# Patient Record
Sex: Female | Born: 1945 | Race: White | Hispanic: No | Marital: Married | Smoking: Former smoker
Health system: Southern US, Community
[De-identification: ages and names within clinical notes are randomized; demographics above are authoritative.]

## PROBLEM LIST (undated history)

## (undated) DIAGNOSIS — E785 Hyperlipidemia, unspecified: Secondary | ICD-10-CM

## (undated) DIAGNOSIS — I639 Cerebral infarction, unspecified: Secondary | ICD-10-CM

## (undated) HISTORY — DX: Hyperlipidemia, unspecified: E78.5

## (undated) HISTORY — PX: ECTOPIC PREGNANCY SURGERY: SHX613

## (undated) HISTORY — DX: Cerebral infarction, unspecified: I63.9

---

## 1999-06-06 ENCOUNTER — Other Ambulatory Visit: Admission: RE | Admit: 1999-06-06 | Discharge: 1999-06-06 | Payer: Self-pay | Admitting: Obstetrics and Gynecology

## 2000-06-12 ENCOUNTER — Other Ambulatory Visit: Admission: RE | Admit: 2000-06-12 | Discharge: 2000-06-12 | Payer: Self-pay | Admitting: Obstetrics and Gynecology

## 2000-06-13 ENCOUNTER — Encounter (INDEPENDENT_AMBULATORY_CARE_PROVIDER_SITE_OTHER): Payer: Self-pay

## 2000-06-13 ENCOUNTER — Other Ambulatory Visit: Admission: RE | Admit: 2000-06-13 | Discharge: 2000-06-13 | Payer: Self-pay | Admitting: Obstetrics and Gynecology

## 2000-07-26 ENCOUNTER — Ambulatory Visit (HOSPITAL_COMMUNITY): Admission: RE | Admit: 2000-07-26 | Discharge: 2000-07-26 | Payer: Self-pay | Admitting: Obstetrics and Gynecology

## 2000-08-01 ENCOUNTER — Encounter: Admission: RE | Admit: 2000-08-01 | Discharge: 2000-08-01 | Payer: Self-pay | Admitting: Obstetrics and Gynecology

## 2000-08-01 ENCOUNTER — Encounter: Payer: Self-pay | Admitting: Obstetrics and Gynecology

## 2000-11-29 ENCOUNTER — Ambulatory Visit (HOSPITAL_COMMUNITY): Admission: RE | Admit: 2000-11-29 | Discharge: 2000-11-29 | Payer: Self-pay | Admitting: Gastroenterology

## 2001-06-17 ENCOUNTER — Other Ambulatory Visit: Admission: RE | Admit: 2001-06-17 | Discharge: 2001-06-17 | Payer: Self-pay | Admitting: Obstetrics and Gynecology

## 2001-08-05 ENCOUNTER — Encounter: Payer: Self-pay | Admitting: Obstetrics and Gynecology

## 2001-08-05 ENCOUNTER — Encounter: Admission: RE | Admit: 2001-08-05 | Discharge: 2001-08-05 | Payer: Self-pay | Admitting: Obstetrics and Gynecology

## 2001-08-08 ENCOUNTER — Encounter: Payer: Self-pay | Admitting: Obstetrics and Gynecology

## 2001-08-08 ENCOUNTER — Encounter: Admission: RE | Admit: 2001-08-08 | Discharge: 2001-08-08 | Payer: Self-pay | Admitting: Obstetrics and Gynecology

## 2002-06-24 ENCOUNTER — Other Ambulatory Visit: Admission: RE | Admit: 2002-06-24 | Discharge: 2002-06-24 | Payer: Self-pay | Admitting: Obstetrics and Gynecology

## 2002-08-11 ENCOUNTER — Ambulatory Visit (HOSPITAL_COMMUNITY): Admission: RE | Admit: 2002-08-11 | Discharge: 2002-08-11 | Payer: Self-pay | Admitting: Obstetrics and Gynecology

## 2002-08-11 ENCOUNTER — Encounter: Payer: Self-pay | Admitting: Obstetrics and Gynecology

## 2003-07-01 ENCOUNTER — Other Ambulatory Visit: Admission: RE | Admit: 2003-07-01 | Discharge: 2003-07-01 | Payer: Self-pay | Admitting: Obstetrics and Gynecology

## 2003-08-13 ENCOUNTER — Encounter: Payer: Self-pay | Admitting: Obstetrics and Gynecology

## 2003-08-13 ENCOUNTER — Ambulatory Visit (HOSPITAL_COMMUNITY): Admission: RE | Admit: 2003-08-13 | Discharge: 2003-08-13 | Payer: Self-pay | Admitting: Obstetrics and Gynecology

## 2004-07-06 ENCOUNTER — Other Ambulatory Visit: Admission: RE | Admit: 2004-07-06 | Discharge: 2004-07-06 | Payer: Self-pay | Admitting: Obstetrics and Gynecology

## 2004-08-16 ENCOUNTER — Ambulatory Visit (HOSPITAL_COMMUNITY): Admission: RE | Admit: 2004-08-16 | Discharge: 2004-08-16 | Payer: Self-pay | Admitting: Obstetrics and Gynecology

## 2005-08-28 ENCOUNTER — Ambulatory Visit (HOSPITAL_COMMUNITY): Admission: RE | Admit: 2005-08-28 | Discharge: 2005-08-28 | Payer: Self-pay | Admitting: Obstetrics and Gynecology

## 2005-09-11 ENCOUNTER — Other Ambulatory Visit: Admission: RE | Admit: 2005-09-11 | Discharge: 2005-09-11 | Payer: Self-pay | Admitting: Obstetrics and Gynecology

## 2006-04-15 ENCOUNTER — Encounter (INDEPENDENT_AMBULATORY_CARE_PROVIDER_SITE_OTHER): Payer: Self-pay | Admitting: Specialist

## 2006-04-15 ENCOUNTER — Ambulatory Visit (HOSPITAL_COMMUNITY): Admission: RE | Admit: 2006-04-15 | Discharge: 2006-04-15 | Payer: Self-pay | Admitting: Gastroenterology

## 2006-08-29 ENCOUNTER — Ambulatory Visit (HOSPITAL_COMMUNITY): Admission: RE | Admit: 2006-08-29 | Discharge: 2006-08-29 | Payer: Self-pay | Admitting: Obstetrics and Gynecology

## 2007-09-02 ENCOUNTER — Ambulatory Visit (HOSPITAL_COMMUNITY): Admission: RE | Admit: 2007-09-02 | Discharge: 2007-09-02 | Payer: Self-pay | Admitting: Obstetrics and Gynecology

## 2008-04-20 ENCOUNTER — Emergency Department (HOSPITAL_COMMUNITY): Admission: EM | Admit: 2008-04-20 | Discharge: 2008-04-20 | Payer: Self-pay | Admitting: Emergency Medicine

## 2008-09-03 ENCOUNTER — Ambulatory Visit (HOSPITAL_COMMUNITY): Admission: RE | Admit: 2008-09-03 | Discharge: 2008-09-03 | Payer: Self-pay | Admitting: Obstetrics and Gynecology

## 2008-11-04 ENCOUNTER — Emergency Department (HOSPITAL_COMMUNITY): Admission: EM | Admit: 2008-11-04 | Discharge: 2008-11-04 | Payer: Self-pay | Admitting: Family Medicine

## 2009-05-31 ENCOUNTER — Emergency Department (HOSPITAL_COMMUNITY): Admission: EM | Admit: 2009-05-31 | Discharge: 2009-05-31 | Payer: Self-pay | Admitting: Family Medicine

## 2009-09-05 ENCOUNTER — Ambulatory Visit (HOSPITAL_COMMUNITY): Admission: RE | Admit: 2009-09-05 | Discharge: 2009-09-05 | Payer: Self-pay | Admitting: Obstetrics and Gynecology

## 2009-09-07 ENCOUNTER — Ambulatory Visit (HOSPITAL_COMMUNITY): Admission: RE | Admit: 2009-09-07 | Discharge: 2009-09-07 | Payer: Self-pay | Admitting: Obstetrics and Gynecology

## 2009-09-20 ENCOUNTER — Ambulatory Visit: Payer: Self-pay | Admitting: Sports Medicine

## 2009-09-20 DIAGNOSIS — S8263XA Displaced fracture of lateral malleolus of unspecified fibula, initial encounter for closed fracture: Secondary | ICD-10-CM | POA: Insufficient documentation

## 2009-09-20 DIAGNOSIS — M25579 Pain in unspecified ankle and joints of unspecified foot: Secondary | ICD-10-CM | POA: Insufficient documentation

## 2009-10-19 ENCOUNTER — Ambulatory Visit: Payer: Self-pay | Admitting: Sports Medicine

## 2010-10-10 ENCOUNTER — Ambulatory Visit (HOSPITAL_COMMUNITY)
Admission: RE | Admit: 2010-10-10 | Discharge: 2010-10-10 | Payer: Self-pay | Source: Home / Self Care | Admitting: Obstetrics and Gynecology

## 2011-03-30 NOTE — Op Note (Signed)
Rachel Douglas, Rachel Douglas             ACCOUNT NO.:  [id number]   MEDICAL RECORD NO.:  [id number]          PATIENT TYPE:  AMB   LOCATION:  ENDO                         FACILITY:  [facility]   PHYSICIAN:  John C. Madilyn Fireman, M.D.    DATE OF BIRTH:  [birth year]   DATE OF PROCEDURE:  04/15/2006  DATE OF DISCHARGE:                                 OPERATIVE REPORT   PROCEDURE:  Colonoscopy with polypectomy.   INDICATIONS FOR PROCEDURE:  History of adenomatous colon polyps with last  colonoscopy 5 years ago.   PROCEDURE:  The patient was placed in the left lateral decubitus position  and placed on the pulse monitor with continuous low-flow oxygen delivered by  nasal cannula.  He was sedated with 50 mcg IV fentanyl and 6 mg IV Versed.  The Olympus video colonoscope was inserted in the rectum and advanced to the  cecum, confirmed by transillumination at McBurney's point and visualization  of the ileocecal valve and appendiceal orifice, the prep was excellent.  The  cecum, ascending, transverse and descending colon all appeared normal with  no masses, polyps, diverticula or other mucosal abnormalities.  Within the  sigmoid colon, there was seen a 6 mm sessile polyp which was removed by hot  biopsy. The remainder of the sigmoid and rectum appeared normal.  The scope  was then withdrawn and the patient returned to the recovery room in stable  condition. She tolerated the procedure well and there were no immediate  complications.   IMPRESSION:  Sigmoid colon polyp, otherwise normal study.   PLAN:  Await histology and will probably repeat study in 5 years.           ______________________________  Everardo All Madilyn Fireman, M.D.     [facility]/MEDQ  D:  04/15/2006  T:  04/15/2006  Job:  [id number]   cc:   Miguel Aschoff, M.D.  Fax: [phone]

## 2011-03-30 NOTE — Procedures (Signed)
[facility]  Patient:    Rachel Douglas, Rachel Douglas                    MRN: [id number] Proc. Date: 11/29/00 Adm. Date:  [id number] Disc. Date: [id number] Attending:  Louie Bun CC:         Charles A. Tenny Craw, M.D.   Procedure Report  PROCEDURE:  Colonoscopy.  INDICATIONS FOR PROCEDURE:  History of colon polyps with last colonoscopy three years ago.  DESCRIPTION OF PROCEDURE:  The patient was placed in the left lateral decubitus position and placed on the pulse monitor with continuous low flow oxygen delivered by nasal cannula. She was sedated with 60 mg IV Demerol and 6 mg IV Versed. The Olympus video colonoscope was inserted into the rectum and advanced to the cecum, confirmed by transillumination at McBurneys point and visualization of the ileocecal valve and appendiceal orifice. The prep was excellent. The cecum, ascending, transverse, descending and sigmoid colon all appeared normal with no masses, polyps, diverticula or other mucosal abnormalities. The rectum likewise appeared normal and retroflexed view of the anus revealed some small internal hemorrhoids. The colonoscope was then withdrawn and the patient returned to the recovery room in stable condition. The patient tolerated the procedure well and there were no immediate complications.  IMPRESSION:  Internal hemorrhoids otherwise normal colonoscopy.  PLAN:  Consider repeat colonoscopy in approximately five years. DD:  11/29/00 TD:  11/30/00 Job: [id number]

## 2011-03-30 NOTE — Op Note (Signed)
[facility]  Patient:    Rachel Douglas, Rachel Douglas                    MRN: [id number] Proc. Date: 07/26/00 Adm. Date:  [id number] Disc. Date: [id number] Attending:  Michaele Offer                           Operative Report  PREOPERATIVE DIAGNOSIS:  Postmenopausal bleeding and probable endometrial polyp.  POSTOPERATIVE DIAGNOSIS:  Postmenopausal bleeding and atrial thick endometrium.  PROCEDURE:  Hysteroscopy with dilation and curettage.  SURGEON:  Zenaida Niece, M.D.  ANESTHESIA:  General with an LMA.  ESTIMATED BLOOD LOSS:  Less than 50 cc.  FINDINGS:  Normal appearing uterus with 2-3 small sessile pieces of endometrial tissue.  The endometrium was otherwise atrophic with no significant lesions.  Counts correct.  Condition stable.  DESCRIPTION OF PROCEDURE:  After appropriate informed consent was obtained, the patient was taken to the operating room and placed in the dorsal supine position.  General anesthesia was induced, and she was placed in mobile stirrups.  Her perineum and vagina were then prepped and draped in the usual sterile fashion and her bladder drained with a red rubber catheter.  A weighted speculum was inserted in the vagina, and a running retractor used anteriorly.  The cervix was grasped with a single-tooth tenaculum on the anterior lip.  The uterus then sounded to 9 cm.  The cervix was gradually dilated to a size 31 Hegar dilator without much difficulty.  The resectoscope was then inserted, and good pictures were obtained.  The 2-3 small sessile pieces of endometrial tissue were removed just by rubbing across then, and there was no significant bleeding.  Adequate visualization was achieved and again, there were no other significant lesions noted.  The hysteroscope was then removed, and gentle curettage was performed to make sure there was no significant tissue, and no tissue was obtained.  The single-tooth tenaculum was  then removed and bleeding controlled with pressure.  All instruments were then removed from the vagina.  The patient was awakened in the operating room, tolerated the procedure well, and taken to the recovery room in stable condition.  Ins and outs through the hysteroscope showed no significant fluid deficit.DD:  07/26/00 TD:  07/28/00 Job: [id number]

## 2011-08-09 LAB — URINALYSIS, MICROSCOPIC ONLY
Bilirubin Urine: NEGATIVE
Glucose, UA: NEGATIVE
Protein, ur: 30 — AB
Specific Gravity, Urine: 1.02
Urobilinogen, UA: 0.2

## 2011-08-09 LAB — POCT URINALYSIS DIP (DEVICE)
Bilirubin Urine: NEGATIVE
Ketones, ur: NEGATIVE
Operator id: 247071

## 2011-08-09 LAB — URINE CULTURE: Colony Count: 3000

## 2011-09-04 ENCOUNTER — Other Ambulatory Visit (HOSPITAL_COMMUNITY): Payer: Self-pay | Admitting: Obstetrics and Gynecology

## 2011-09-04 DIAGNOSIS — Z1231 Encounter for screening mammogram for malignant neoplasm of breast: Secondary | ICD-10-CM

## 2011-10-12 ENCOUNTER — Emergency Department (HOSPITAL_COMMUNITY)
Admission: EM | Admit: 2011-10-12 | Discharge: 2011-10-12 | Disposition: A | Discharge status: Home / Self Care | Payer: 59 | Source: Home / Self Care | Attending: Family Medicine | Admitting: Family Medicine

## 2011-10-12 DIAGNOSIS — S8012XA Contusion of left lower leg, initial encounter: Secondary | ICD-10-CM

## 2011-10-12 DIAGNOSIS — S8010XA Contusion of unspecified lower leg, initial encounter: Secondary | ICD-10-CM

## 2011-10-12 NOTE — ED Notes (Signed)
Patient  noted a bruise on left calf yest, and today the area is painful; does not recall injury to area. Co-worker drew circle w pen , and advised her she should be checked for a clot , since this is how his problem started out; no calf  pain w extension or flexion of foot

## 2011-10-12 NOTE — ED Provider Notes (Signed)
History     CSN: [id number] Arrival date & time: 10/12/2011  6:33 PM   First MD Initiated Contact with Patient 10/12/11 1659      Chief Complaint  Patient presents with  . Leg Pain    (Consider location/radiation/quality/duration/timing/severity/associated sxs/prior treatment) HPI Comments: PATIENT STATES SHE NOTED A BRUISE ON HER LEFT CALF YESTERDAY. SOME PAIN TODAY. DOES NOT RECALL INJURY. STATES A COWORKER TOLD HER TO GET CHECKED FOR A BLOOD CLOT. NO HX OF CLOTS OR BLEEDING PROBLEMS.   The history is provided by the patient.    History reviewed. No pertinent past medical history.  History reviewed. No pertinent past surgical history.  History reviewed. No pertinent family history.  History  Substance Use Topics  . Smoking status: Not on file  . Smokeless tobacco: Not on file  . Alcohol Use: Not on file    OB History    Grav Para Term Preterm Abortions TAB SAB Ect Mult Living                  Review of Systems  Constitutional: Negative.   HENT: Negative.   Respiratory: Negative.   Cardiovascular: Negative.   Gastrointestinal: Negative.   Genitourinary: Negative.   Hematological: Negative.     Allergies  Review of patient's allergies indicates no known allergies.  Home Medications  No current outpatient prescriptions on file.  BP 143/77  Pulse 61  Temp(Src) 98.6 F (37 C) (Oral)  Resp 12  SpO2 100%  Physical Exam  Nursing note and vitals reviewed. Constitutional: She appears well-developed and well-nourished. No distress.  Cardiovascular: Normal rate and regular rhythm.   Pulmonary/Chest: Effort normal and breath sounds normal.  Skin:       CONTUSION NOTED MEDIAL LEFT CALF. SLIGHT TENDERNESS. NEG HOMANS. ROM INTACT. NO CORDS. NO SWELLING OR ERYTHEMA    ED Course  Procedures (including critical care time)  Labs Reviewed - No data to display No results found.   1. Contusion of lower leg, left       MDM          Randa Spike, MD 10/12/11 [phone]

## 2011-10-15 ENCOUNTER — Ambulatory Visit (HOSPITAL_COMMUNITY)
Admission: RE | Admit: 2011-10-15 | Discharge: 2011-10-15 | Disposition: A | Discharge status: Home / Self Care | Payer: 59 | Source: Ambulatory Visit | Attending: Obstetrics and Gynecology | Admitting: Obstetrics and Gynecology

## 2011-10-15 DIAGNOSIS — Z1231 Encounter for screening mammogram for malignant neoplasm of breast: Secondary | ICD-10-CM | POA: Insufficient documentation

## 2011-12-10 ENCOUNTER — Other Ambulatory Visit (HOSPITAL_COMMUNITY): Payer: Self-pay | Admitting: Urology

## 2011-12-13 ENCOUNTER — Ambulatory Visit (HOSPITAL_COMMUNITY)
Admission: RE | Admit: 2011-12-13 | Discharge: 2011-12-13 | Disposition: A | Discharge status: Home / Self Care | Payer: 59 | Source: Ambulatory Visit | Attending: Urology | Admitting: Urology

## 2011-12-13 DIAGNOSIS — Q619 Cystic kidney disease, unspecified: Secondary | ICD-10-CM | POA: Insufficient documentation

## 2011-12-13 DIAGNOSIS — R319 Hematuria, unspecified: Secondary | ICD-10-CM | POA: Insufficient documentation

## 2012-08-13 ENCOUNTER — Encounter: Payer: 59 | Attending: Obstetrics and Gynecology | Admitting: Dietician

## 2012-08-13 ENCOUNTER — Encounter: Payer: Self-pay | Admitting: Dietician

## 2012-08-13 VITALS — Ht 61.0 in | Wt 146.1 lb

## 2012-08-13 DIAGNOSIS — Z713 Dietary counseling and surveillance: Secondary | ICD-10-CM | POA: Insufficient documentation

## 2012-08-13 DIAGNOSIS — E785 Hyperlipidemia, unspecified: Secondary | ICD-10-CM | POA: Insufficient documentation

## 2012-08-13 NOTE — Progress Notes (Signed)
  Medical Nutrition Therapy:  Appt start time: 1630 end time:  1730.   Assessment:  Primary concerns today: Comes today with issues around borderline lipid levels. Wants to keep her cholesterol levels as normal as possible.  Does not want to have to go on cholesterol lowering agents.  Has done reading and some dietary research and is currently trying to follow an eating plan for lowering cholesterol/fat, is trying to regularly exercise and to lose weight.  She does admit that with her husband going to work on the second shift, she is finding herself snack more in the evening.  Snacks are most often processed foods that contain calories and fat. 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  While she does not buy candy, she will more frequently is having a piece or two of candy each day from the dish.  MEDICATIONS: Currently on no medications.   DIETARY INTAKE:  Usual eating pattern includes 3 meals and 1-2 snacks per day.  24-hr recall:  B ( AM): oatmeal or yogurt. (high protein)  Snk ( AM): drink Naked that contains vegetables and fruits.  L ( PM): Pita bread and tuna sandwich or bowl of vegetarian soup.water. Snk ( PM): none D ( PM): chick filet'a with a small fry.  Meat (red) now doing more chicken, vegetable.+/- potatoes. Snk ( PM): While watching TV the refrigerator is fair game.  Will snack on chips and items from the refrigerator.   Beverages: water, coffee, juice.    Usual physical activity: Monday, Tuesday, Wednesday and Friday, exercises. Combination of  pilates, aerobics and aerobic mix. For 45-60 minutes.  On the weekend does yard work or gardening.    Estimated energy needs:HT:61 in  WT: 146.1 lb  BMI: 27.7 kg/m2  Adj WT: 118 lb (54 kg) 1300-1400 calories 150-155 g carbohydrates 100-105  g protein 35-38 g fat  Progress Towards Goal(s):  In progress.   Nutritional Diagnosis:  Metabolic Imbalance;  Hyperlipidemia;  as evidenced by total cholesterol of 209, LDL cholesterol of 127 mg.    Intervention:  Nutrition Review of the normal ranges for cholesterol and the implications for continuing and increasing her current exercise regimen to assist with increasing her HDL cholesterol, decreasing her LDL cholesterol and weight loss which would facilitate improvement in both.  Discussed the use of fruits, complex carbs and lean proteins as snacks.  Encouraged using exercise, portion control and improved food choices to facilitate weight loss for improved weight loss and improved blood lipids.  Handouts given during visit include:  Understanding and Managing your Cholesterol  [facility] handout for Improving HDL, LDL, and Triglycerides.  Cholesterol and Triglycerides: The Norms and Mine.  Monitoring/Evaluation:  Dietary intake, exercise,and body weight 12 weeks.Marland Kitchen

## 2012-09-15 ENCOUNTER — Other Ambulatory Visit (HOSPITAL_COMMUNITY): Payer: Self-pay | Admitting: Obstetrics and Gynecology

## 2012-09-15 DIAGNOSIS — Z1231 Encounter for screening mammogram for malignant neoplasm of breast: Secondary | ICD-10-CM

## 2012-10-15 ENCOUNTER — Ambulatory Visit (HOSPITAL_COMMUNITY)
Admission: RE | Admit: 2012-10-15 | Discharge: 2012-10-15 | Disposition: A | Discharge status: Home / Self Care | Payer: 59 | Source: Ambulatory Visit | Attending: Obstetrics and Gynecology | Admitting: Obstetrics and Gynecology

## 2012-10-15 DIAGNOSIS — Z1231 Encounter for screening mammogram for malignant neoplasm of breast: Secondary | ICD-10-CM | POA: Insufficient documentation

## 2013-09-21 ENCOUNTER — Other Ambulatory Visit (HOSPITAL_COMMUNITY): Payer: Self-pay | Admitting: Obstetrics and Gynecology

## 2013-09-21 DIAGNOSIS — Z1231 Encounter for screening mammogram for malignant neoplasm of breast: Secondary | ICD-10-CM

## 2013-10-16 ENCOUNTER — Ambulatory Visit (HOSPITAL_COMMUNITY)
Admission: RE | Admit: 2013-10-16 | Discharge: 2013-10-16 | Disposition: A | Discharge status: Home / Self Care | Payer: 59 | Source: Ambulatory Visit | Attending: Obstetrics and Gynecology | Admitting: Obstetrics and Gynecology

## 2013-10-16 DIAGNOSIS — Z1231 Encounter for screening mammogram for malignant neoplasm of breast: Secondary | ICD-10-CM | POA: Insufficient documentation

## 2013-12-09 ENCOUNTER — Other Ambulatory Visit: Payer: Self-pay | Admitting: Physician Assistant

## 2013-12-23 ENCOUNTER — Other Ambulatory Visit: Payer: Self-pay | Admitting: Physician Assistant

## 2014-11-29 ENCOUNTER — Other Ambulatory Visit (HOSPITAL_COMMUNITY): Payer: Self-pay | Admitting: Obstetrics and Gynecology

## 2014-11-29 DIAGNOSIS — Z1231 Encounter for screening mammogram for malignant neoplasm of breast: Secondary | ICD-10-CM

## 2015-01-31 ENCOUNTER — Ambulatory Visit (HOSPITAL_COMMUNITY)
Admission: RE | Admit: 2015-01-31 | Discharge: 2015-01-31 | Disposition: A | Discharge status: Home / Self Care | Payer: Medicare Other | Source: Ambulatory Visit | Attending: Obstetrics and Gynecology | Admitting: Obstetrics and Gynecology

## 2015-01-31 DIAGNOSIS — Z1231 Encounter for screening mammogram for malignant neoplasm of breast: Secondary | ICD-10-CM | POA: Insufficient documentation

## 2015-12-28 ENCOUNTER — Other Ambulatory Visit: Payer: Self-pay

## 2015-12-28 DIAGNOSIS — Z1231 Encounter for screening mammogram for malignant neoplasm of breast: Secondary | ICD-10-CM

## 2016-01-10 DIAGNOSIS — H1033 Unspecified acute conjunctivitis, bilateral: Secondary | ICD-10-CM | POA: Diagnosis not present

## 2016-02-01 ENCOUNTER — Ambulatory Visit
Admission: RE | Admit: 2016-02-01 | Discharge: 2016-02-01 | Disposition: A | Discharge status: Home / Self Care | Payer: Medicare HMO | Source: Ambulatory Visit

## 2016-02-01 DIAGNOSIS — Z1231 Encounter for screening mammogram for malignant neoplasm of breast: Secondary | ICD-10-CM | POA: Diagnosis not present

## 2016-02-22 DIAGNOSIS — E7800 Pure hypercholesterolemia, unspecified: Secondary | ICD-10-CM | POA: Diagnosis not present

## 2016-02-22 DIAGNOSIS — Z131 Encounter for screening for diabetes mellitus: Secondary | ICD-10-CM | POA: Diagnosis not present

## 2016-02-29 DIAGNOSIS — G43909 Migraine, unspecified, not intractable, without status migrainosus: Secondary | ICD-10-CM | POA: Diagnosis not present

## 2016-02-29 DIAGNOSIS — Z0000 Encounter for general adult medical examination without abnormal findings: Secondary | ICD-10-CM | POA: Diagnosis not present

## 2016-02-29 DIAGNOSIS — G5600 Carpal tunnel syndrome, unspecified upper limb: Secondary | ICD-10-CM | POA: Diagnosis not present

## 2016-05-11 DIAGNOSIS — R69 Illness, unspecified: Secondary | ICD-10-CM | POA: Diagnosis not present

## 2016-06-20 DIAGNOSIS — L570 Actinic keratosis: Secondary | ICD-10-CM | POA: Diagnosis not present

## 2016-06-20 DIAGNOSIS — L7211 Pilar cyst: Secondary | ICD-10-CM | POA: Diagnosis not present

## 2016-06-20 DIAGNOSIS — D239 Other benign neoplasm of skin, unspecified: Secondary | ICD-10-CM | POA: Diagnosis not present

## 2016-07-12 ENCOUNTER — Other Ambulatory Visit: Payer: Self-pay | Admitting: Physician Assistant

## 2016-07-12 DIAGNOSIS — D485 Neoplasm of uncertain behavior of skin: Secondary | ICD-10-CM | POA: Diagnosis not present

## 2016-07-12 DIAGNOSIS — L7211 Pilar cyst: Secondary | ICD-10-CM | POA: Diagnosis not present

## 2016-07-23 DIAGNOSIS — H5202 Hypermetropia, left eye: Secondary | ICD-10-CM | POA: Diagnosis not present

## 2016-07-23 DIAGNOSIS — H52223 Regular astigmatism, bilateral: Secondary | ICD-10-CM | POA: Diagnosis not present

## 2016-07-23 DIAGNOSIS — H5211 Myopia, right eye: Secondary | ICD-10-CM | POA: Diagnosis not present

## 2016-07-23 DIAGNOSIS — H2513 Age-related nuclear cataract, bilateral: Secondary | ICD-10-CM | POA: Diagnosis not present

## 2016-07-26 DIAGNOSIS — Z86010 Personal history of colonic polyps: Secondary | ICD-10-CM | POA: Diagnosis not present

## 2016-07-26 DIAGNOSIS — D126 Benign neoplasm of colon, unspecified: Secondary | ICD-10-CM | POA: Diagnosis not present

## 2016-07-26 DIAGNOSIS — K5730 Diverticulosis of large intestine without perforation or abscess without bleeding: Secondary | ICD-10-CM | POA: Diagnosis not present

## 2016-08-02 DIAGNOSIS — D126 Benign neoplasm of colon, unspecified: Secondary | ICD-10-CM | POA: Diagnosis not present

## 2016-08-04 DIAGNOSIS — Z23 Encounter for immunization: Secondary | ICD-10-CM | POA: Diagnosis not present

## 2016-09-07 ENCOUNTER — Encounter (HOSPITAL_COMMUNITY): Payer: Self-pay | Admitting: Emergency Medicine

## 2016-09-07 ENCOUNTER — Inpatient Hospital Stay (HOSPITAL_COMMUNITY)
Admission: EM | Admit: 2016-09-07 | Discharge: 2016-09-09 | DRG: 066 | Disposition: A | Discharge status: Home / Self Care | Payer: Medicare HMO | Attending: Internal Medicine | Admitting: Internal Medicine

## 2016-09-07 ENCOUNTER — Emergency Department (HOSPITAL_COMMUNITY): Payer: Medicare HMO

## 2016-09-07 DIAGNOSIS — R269 Unspecified abnormalities of gait and mobility: Secondary | ICD-10-CM

## 2016-09-07 DIAGNOSIS — I6789 Other cerebrovascular disease: Secondary | ICD-10-CM | POA: Diagnosis not present

## 2016-09-07 DIAGNOSIS — R29898 Other symptoms and signs involving the musculoskeletal system: Secondary | ICD-10-CM | POA: Diagnosis not present

## 2016-09-07 DIAGNOSIS — Z9889 Other specified postprocedural states: Secondary | ICD-10-CM

## 2016-09-07 DIAGNOSIS — Z7982 Long term (current) use of aspirin: Secondary | ICD-10-CM | POA: Diagnosis not present

## 2016-09-07 DIAGNOSIS — Z833 Family history of diabetes mellitus: Secondary | ICD-10-CM

## 2016-09-07 DIAGNOSIS — E785 Hyperlipidemia, unspecified: Secondary | ICD-10-CM | POA: Diagnosis not present

## 2016-09-07 DIAGNOSIS — Z823 Family history of stroke: Secondary | ICD-10-CM

## 2016-09-07 DIAGNOSIS — R296 Repeated falls: Secondary | ICD-10-CM | POA: Diagnosis not present

## 2016-09-07 DIAGNOSIS — I63519 Cerebral infarction due to unspecified occlusion or stenosis of unspecified middle cerebral artery: Secondary | ICD-10-CM | POA: Diagnosis not present

## 2016-09-07 DIAGNOSIS — I639 Cerebral infarction, unspecified: Principal | ICD-10-CM | POA: Diagnosis present

## 2016-09-07 DIAGNOSIS — M6281 Muscle weakness (generalized): Secondary | ICD-10-CM | POA: Diagnosis not present

## 2016-09-07 DIAGNOSIS — I10 Essential (primary) hypertension: Secondary | ICD-10-CM | POA: Diagnosis not present

## 2016-09-07 DIAGNOSIS — Z79899 Other long term (current) drug therapy: Secondary | ICD-10-CM | POA: Diagnosis not present

## 2016-09-07 DIAGNOSIS — Z87891 Personal history of nicotine dependence: Secondary | ICD-10-CM | POA: Diagnosis not present

## 2016-09-07 DIAGNOSIS — R350 Frequency of micturition: Secondary | ICD-10-CM | POA: Diagnosis not present

## 2016-09-07 DIAGNOSIS — N390 Urinary tract infection, site not specified: Secondary | ICD-10-CM | POA: Diagnosis not present

## 2016-09-07 DIAGNOSIS — Z809 Family history of malignant neoplasm, unspecified: Secondary | ICD-10-CM

## 2016-09-07 LAB — DIFFERENTIAL
Basophils Absolute: 0.1 10*3/uL (ref 0.0–0.1)
Basophils Relative: 1 %
EOS ABS: 0.3 10*3/uL (ref 0.0–0.7)
EOS PCT: 4 %
LYMPHS ABS: 2.6 10*3/uL (ref 0.7–4.0)
Lymphocytes Relative: 35 %
Monocytes Absolute: 0.7 10*3/uL (ref 0.1–1.0)
Monocytes Relative: 9 %
NEUTROS PCT: 51 %
Neutro Abs: 3.9 10*3/uL (ref 1.7–7.7)

## 2016-09-07 LAB — URINE MICROSCOPIC-ADD ON

## 2016-09-07 LAB — COMPREHENSIVE METABOLIC PANEL
ALBUMIN: 4.4 g/dL (ref 3.5–5.0)
ALT: 18 U/L (ref 14–54)
ANION GAP: 6 (ref 5–15)
AST: 15 U/L (ref 15–41)
Alkaline Phosphatase: 58 U/L (ref 38–126)
BILIRUBIN TOTAL: 0.3 mg/dL (ref 0.3–1.2)
BUN: 24 mg/dL — ABNORMAL HIGH (ref 6–20)
CO2: 28 mmol/L (ref 22–32)
Calcium: 10.2 mg/dL (ref 8.9–10.3)
Chloride: 106 mmol/L (ref 101–111)
Creatinine, Ser: 0.75 mg/dL (ref 0.44–1.00)
GLUCOSE: 95 mg/dL (ref 65–99)
POTASSIUM: 4 mmol/L (ref 3.5–5.1)
Sodium: 140 mmol/L (ref 135–145)
TOTAL PROTEIN: 7.3 g/dL (ref 6.5–8.1)

## 2016-09-07 LAB — URINALYSIS, ROUTINE W REFLEX MICROSCOPIC
BILIRUBIN URINE: NEGATIVE
Glucose, UA: NEGATIVE mg/dL
KETONES UR: NEGATIVE mg/dL
NITRITE: NEGATIVE
PROTEIN: NEGATIVE mg/dL
Specific Gravity, Urine: 1.012 (ref 1.005–1.030)
pH: 5.5 (ref 5.0–8.0)

## 2016-09-07 LAB — PROTIME-INR
INR: 0.93
PROTHROMBIN TIME: 12.4 s (ref 11.4–15.2)

## 2016-09-07 LAB — CBC
HCT: 40.5 % (ref 36.0–46.0)
HEMOGLOBIN: 13.6 g/dL (ref 12.0–15.0)
MCH: 30.5 pg (ref 26.0–34.0)
MCHC: 33.6 g/dL (ref 30.0–36.0)
MCV: 90.8 fL (ref 78.0–100.0)
Platelets: 334 10*3/uL (ref 150–400)
RBC: 4.46 MIL/uL (ref 3.87–5.11)
RDW: 13 % (ref 11.5–15.5)
WBC: 7.5 10*3/uL (ref 4.0–10.5)

## 2016-09-07 LAB — ETHANOL: Alcohol, Ethyl (B): <5 mg/dL (ref <5)

## 2016-09-07 LAB — RAPID URINE DRUG SCREEN, HOSP PERFORMED
Amphetamines: NOT DETECTED
Barbiturates: NOT DETECTED
Benzodiazepines: NOT DETECTED
COCAINE: NOT DETECTED
OPIATES: NOT DETECTED
Tetrahydrocannabinol: NOT DETECTED

## 2016-09-07 LAB — I-STAT TROPONIN, ED: TROPONIN I, POC: 0 ng/mL (ref 0.00–0.08)

## 2016-09-07 LAB — I-STAT CHEM 8, ED
BUN: 24 mg/dL — ABNORMAL HIGH (ref 6–20)
Calcium, Ion: 1.26 mmol/L (ref 1.15–1.40)
Chloride: 103 mmol/L (ref 101–111)
Creatinine, Ser: 0.8 mg/dL (ref 0.44–1.00)
Glucose, Bld: 90 mg/dL (ref 65–99)
HEMATOCRIT: 40 % (ref 36.0–46.0)
HEMOGLOBIN: 13.6 g/dL (ref 12.0–15.0)
Potassium: 4 mmol/L (ref 3.5–5.1)
SODIUM: 141 mmol/L (ref 135–145)
TCO2: 27 mmol/L (ref 0–100)

## 2016-09-07 LAB — APTT: aPTT: 25 seconds (ref 24–36)

## 2016-09-07 MED ORDER — CEPHALEXIN 500 MG PO CAPS
500.0000 mg | ORAL_CAPSULE | Freq: Once | ORAL | Status: AC
  Administered 2016-09-08: 500 mg via ORAL
  Filled 2016-09-07: qty 1

## 2016-09-07 MED ORDER — ASPIRIN 81 MG PO CHEW
324.0000 mg | CHEWABLE_TABLET | Freq: Once | ORAL | Status: AC
  Administered 2016-09-07: 324 mg via ORAL
  Filled 2016-09-07: qty 4

## 2016-09-07 NOTE — ED Notes (Signed)
Patient transported to MRI 

## 2016-09-07 NOTE — H&P (Signed)
History and Physical    Rachel Douglas [id number] DOB: 07-Aug-1946 DOA: 09/07/2016  PCP: PROVIDER NOT IN SYSTEM  Patient coming from:  home  Chief Complaint:  Left hand heavy, and wobbly  HPI: Rachel Douglas is a 70 y.o. female with medical history significant of over 3 days of having difficulty writing with her left hand it felt very hand and she was not able to hold a pen.  Her husband has also noticed some change in her walking.  No other neurological symptoms.  Her hand is feeling much better and almost back to normal.  Mri shows an acute infarct.  Pt does not take any aspirin daily and is very healthy.  [facility] at [facility] called and requested transfer to [place] for stroke work up.  Review of Systems: As per HPI otherwise 10 point review of systems negative.   Past Medical History:  Diagnosis Date  . Hyperlipidemia     Past Surgical History:  Procedure Laterality Date  . ECTOPIC PREGNANCY SURGERY       reports that she has quit smoking. She has never used smokeless tobacco. She reports that she does not drink alcohol or use drugs.  Allergies  Allergen Reactions  . Codeine Other (See Comments)    Reaction:  Unknown     Family History  Problem Relation Age of Onset  . Cancer Father   . Hyperlipidemia Father   . Diabetes Father   . Stroke Father     Prior to Admission medications   Medication Sig Start Date End Date Taking? Authorizing Provider  cholecalciferol (VITAMIN D) 1000 units tablet Take 1,000 Units by mouth daily.   Yes Historical Provider, MD  CINNAMON PO Take 1 capsule by mouth daily.   Yes Historical Provider, MD  Coenzyme Q10 (COQ10) 200 MG CAPS Take 200 mg by mouth daily.   Yes Historical Provider, MD  CRANBERRY PO Take 1 capsule by mouth daily.   Yes Historical Provider, MD  Multiple Vitamin (MULTIVITAMIN WITH MINERALS) TABS tablet Take 1 tablet by mouth daily.   Yes Historical Provider, MD  SUMAtriptan (IMITREX) 100 MG tablet Take 100 mg by  mouth every 2 (two) hours as needed for migraine.   Yes Historical Provider, MD    Physical Exam: Vitals:   09/07/16 1839 09/07/16 2032 09/07/16 2100 09/07/16 2121  BP: 135/73 157/97 122/60   Pulse: 60 73 84   Resp: 20 21 20    Temp:    98.3 F (36.8 C)  TempSrc:      SpO2: 98% 97% 96%   Weight:      Height:        Constitutional: NAD, calm, comfortable Vitals:   09/07/16 1839 09/07/16 2032 09/07/16 2100 09/07/16 2121  BP: 135/73 157/97 122/60   Pulse: 60 73 84   Resp: 20 21 20    Temp:    98.3 F (36.8 C)  TempSrc:      SpO2: 98% 97% 96%   Weight:      Height:       Eyes: PERRL, lids and conjunctivae normal ENMT: Mucous membranes are moist. Posterior pharynx clear of any exudate or lesions.Normal dentition.  Neck: normal, supple, no masses, no thyromegaly Respiratory: clear to auscultation bilaterally, no wheezing, no crackles. Normal respiratory effort. No accessory muscle use.  Cardiovascular: Regular rate and rhythm, no murmurs / rubs / gallops. No extremity edema. 2+ pedal pulses. No carotid bruits.  Abdomen: no tenderness, no masses palpated. No hepatosplenomegaly. Bowel sounds  positive.  Musculoskeletal: no clubbing / cyanosis. No joint deformity upper and lower extremities. Good ROM, no contractures. Normal muscle tone.  Skin: no rashes, lesions, ulcers. No induration Neurologic: CN 2-12 grossly intact. Sensation intact, DTR normal. Strength 5/5 in all 4.  Psychiatric: Normal judgment and insight. Alert and oriented x 3. Normal mood.    Labs on Admission: I have personally reviewed following labs and imaging studies  CBC:  Recent Labs Lab 09/07/16 1811 09/07/16 1823  WBC 7.5  --   NEUTROABS 3.9  --   HGB 13.6 13.6  HCT 40.5 40.0  MCV 90.8  --   PLT 334  --    Basic Metabolic Panel:  Recent Labs Lab 09/07/16 1811 09/07/16 1823  NA 140 141  K 4.0 4.0  CL 106 103  CO2 28  --   GLUCOSE 95 90  BUN 24* 24*  CREATININE 0.75 0.80  CALCIUM 10.2  --      GFR: Estimated Creatinine Clearance: 56.7 mL/min (by C-G formula based on SCr of 0.8 mg/dL). Liver Function Tests:  Recent Labs Lab 09/07/16 1811  AST 15  ALT 18  ALKPHOS 58  BILITOT 0.3  PROT 7.3  ALBUMIN 4.4    Coagulation Profile:  Recent Labs Lab 09/07/16 1811  INR 0.93    Urine analysis:    Component Value Date/Time   COLORURINE YELLOW 09/07/2016 1630   APPEARANCEUR CLEAR 09/07/2016 1630   LABSPEC 1.012 09/07/2016 1630   PHURINE 5.5 09/07/2016 1630   GLUCOSEU NEGATIVE 09/07/2016 1630   HGBUR SMALL (A) 09/07/2016 1630   BILIRUBINUR NEGATIVE 09/07/2016 1630   KETONESUR NEGATIVE 09/07/2016 1630   PROTEINUR NEGATIVE 09/07/2016 1630   UROBILINOGEN 0.2 04/20/2008 1357   NITRITE NEGATIVE 09/07/2016 1630   LEUKOCYTESUR SMALL (A) 09/07/2016 1630     Radiological Exams on Admission: Mr Brain Wo Contrast  Result Date: 09/07/2016 CLINICAL DATA:  70 y/o  F; gait instability and difficulty writing. EXAM: MRI HEAD WITHOUT CONTRAST TECHNIQUE: Multiplanar, multiecho pulse sequences of the brain and surrounding structures were obtained without intravenous contrast. COMPARISON:  None. FINDINGS: Brain: Focus of diffusion restriction in the right caudate body/mid corona radiata measuring 16 x 8 x 7 mm (AP x ML x CC). Several foci of T2 FLAIR hyperintense signal abnormality in subcortical and periventricular white matter are consistent with moderate chronic microvascular ischemic changes. Mild parenchymal volume loss. Punctate focus of susceptibility hypointensity within left frontal centrum semiovale probably represents hemosiderin deposition from old microhemorrhage. Vascular: Normal flow voids. Skull and upper cervical spine: Normal marrow signal. Sinuses/Orbits: Negative. Other: None. IMPRESSION: 1. Small acute/early subacute infarct within the right caudate body/mid corona radiata. 2. Moderate chronic microvascular ischemic changes and mild parenchymal volume loss for age. These  results were called by telephone at the time of interpretation on 09/07/2016 at 8:25 pm to Dr. Orlie Dakin , who verbally acknowledged these results. Electronically Signed   By: Kristine Garbe M.D.   On: 09/07/2016 20:25    EKG: Independently reviewed. nsr no acute issues  Assessment/Plan 70 yo female with acute infarct in right caudate body/mid corona radiata  Principal Problem:   CVA (cerebral vascular accident) ([place])- place on aspirin.  Symptoms all resolved now.  Obtain cardiac echo and carotid dopplers in the am.  Check hga1c and flp.  Transfer to [facility] for neurology evaluation.  Active Problems:   Abnormal gait   Left hand weakness    DVT prophylaxis:  scds Code Status:   full Family Communication:  husband Disposition Plan:    Per day team Consults called:   neurology Admission status:   observation   Devonne Kitchen A MD [facility]  If 7PM-7AM, please contact night-coverage [email or web address] Password [facility]  09/07/2016, 9:23 PM

## 2016-09-07 NOTE — ED Provider Notes (Signed)
[facility] DEPT Provider Note   CSN: [id number] Arrival date & time: 09/07/16  1625     History   Chief Complaint Chief Complaint  Patient presents with  . altered gait  . Urinary Frequency    HPI Rachel Douglas is a 70 y.o. female.Patient has stumbled while walking twice since 09/03/2016. Her husband has noticed her gait ,, with her wandering to the left side over the past 2 days. Patient has noticed that her hand writing is abnormal since 09/03/2016. She denies pain anywhere. She was evaluated by Dr. Drema Dallas earlier today sent here for further evaluation for concern of stroke. Also complains of urinary frequency onset 4 days ago No other associated symptoms. No treatment prior to coming here. Nothing makes symptoms better or worse. Nothing  HPI  Past Medical History:  Diagnosis Date  . Hyperlipidemia     Patient Active Problem List   Diagnosis Date Noted  . ANKLE PAIN, RIGHT 09/20/2009  . CLOSED FRACTURE OF LATERAL MALLEOLUS 09/20/2009    Past Surgical History:  Procedure Laterality Date  . ECTOPIC PREGNANCY SURGERY      OB History    No data available       Home Medications    Prior to Admission medications   Not on File    Family History Family History  Problem Relation Age of Onset  . Cancer Father   . Hyperlipidemia Father   . Diabetes Father   . Stroke Father     Social History Social History  Substance Use Topics  . Smoking status: Former Research scientist (life sciences)  . Smokeless tobacco: Never Used  . Alcohol use No     Allergies   Review of patient's allergies indicates no known allergies.   Review of Systems Review of Systems  Constitutional: Negative.   HENT: Negative.   Respiratory: Negative.   Cardiovascular: Negative.   Gastrointestinal: Negative.   Genitourinary: Positive for frequency.  Musculoskeletal: Positive for gait problem.  Skin: Negative.   Neurological:       Dysgraphia  Psychiatric/Behavioral: Negative.   All other  systems reviewed and are negative.    Physical Exam Updated Vital Signs BP 142/79 (BP Location: Left Arm)   Pulse (!) 55   Temp 98.4 F (36.9 C) (Oral)   Resp 18   Ht 5\' 1"  (1.549 m)   Wt 140 lb (63.5 kg)   SpO2 97%   BMI 26.45 kg/m   Physical Exam  Constitutional: She is oriented to person, place, and time. She appears well-developed and well-nourished.  HENT:  Head: Normocephalic and atraumatic.  Eyes: Conjunctivae are normal. Pupils are equal, round, and reactive to light.  Neck: Neck supple. No tracheal deviation present. No thyromegaly present.  No bruit  Cardiovascular: Normal rate and regular rhythm.   No murmur heard. Pulmonary/Chest: Effort normal and breath sounds normal.  Abdominal: Soft. Bowel sounds are normal. She exhibits no distension. There is no tenderness.  Musculoskeletal: Normal range of motion. She exhibits no edema or tenderness.  Neurological: She is alert and oriented to person, place, and time. She has normal reflexes. Coordination normal.  DTRs symmetric bilaterally at knee jerk ankle jerk and biceps toes or going bilaterally. Gait normal Romberg normal pronator drift normal finger to nose normal  Skin: Skin is warm and dry. No rash noted.  Psychiatric: She has a normal mood and affect.  Nursing note and vitals reviewed.    ED Treatments / Results  Labs (all labs ordered are listed, but only  abnormal results are displayed) Labs Reviewed  URINALYSIS, ROUTINE W REFLEX MICROSCOPIC (NOT AT [facility]) - Abnormal; Notable for the following:       Result Value   Hgb urine dipstick SMALL (*)    Leukocytes, UA SMALL (*)    All other components within normal limits  URINE MICROSCOPIC-ADD ON - Abnormal; Notable for the following:    Squamous Epithelial / LPF 0-5 (*)    Bacteria, UA MANY (*)    All other components within normal limits  ETHANOL  PROTIME-INR  APTT  CBC  DIFFERENTIAL  COMPREHENSIVE METABOLIC PANEL  RAPID URINE DRUG SCREEN, HOSP  PERFORMED  URINALYSIS, ROUTINE W REFLEX MICROSCOPIC (NOT AT [facility])  I-STAT CHEM 8, ED  I-STAT TROPOININ, ED    EKG  EKG Interpretation  Date/Time:  Friday September 07 2016 18:37:56 EDT Ventricular Rate:  58 PR Interval:    QRS Duration: 89 QT Interval:  402 QTC Calculation: 395 R Axis:   4 Text Interpretation:  Sinus rhythm No old tracing to compare Confirmed by Winfred Leeds  MD, Dre Gamino [phone]) on 09/07/2016 6:45:49 PM      Results for orders placed or performed during the hospital encounter of 09/07/16  Urinalysis, Routine w reflex microscopic- may I&O cath if menses  Result Value Ref Range   Color, Urine YELLOW YELLOW   APPearance CLEAR CLEAR   Specific Gravity, Urine 1.012 1.005 - 1.030   pH 5.5 5.0 - 8.0   Glucose, UA NEGATIVE NEGATIVE mg/dL   Hgb urine dipstick SMALL (A) NEGATIVE   Bilirubin Urine NEGATIVE NEGATIVE   Ketones, ur NEGATIVE NEGATIVE mg/dL   Protein, ur NEGATIVE NEGATIVE mg/dL   Nitrite NEGATIVE NEGATIVE   Leukocytes, UA SMALL (A) NEGATIVE  Ethanol  Result Value Ref Range   Alcohol, Ethyl (B) <5 <5 mg/dL  Protime-INR  Result Value Ref Range   Prothrombin Time 12.4 11.4 - 15.2 seconds   INR 0.93   APTT  Result Value Ref Range   aPTT 25 24 - 36 seconds  CBC  Result Value Ref Range   WBC 7.5 4.0 - 10.5 K/uL   RBC 4.46 3.87 - 5.11 MIL/uL   Hemoglobin 13.6 12.0 - 15.0 g/dL   HCT 40.5 36.0 - 46.0 %   MCV 90.8 78.0 - 100.0 fL   MCH 30.5 26.0 - 34.0 pg   MCHC 33.6 30.0 - 36.0 g/dL   RDW 13.0 11.5 - 15.5 %   Platelets 334 150 - 400 K/uL  Differential  Result Value Ref Range   Neutrophils Relative % 51 %   Neutro Abs 3.9 1.7 - 7.7 K/uL   Lymphocytes Relative 35 %   Lymphs Abs 2.6 0.7 - 4.0 K/uL   Monocytes Relative 9 %   Monocytes Absolute 0.7 0.1 - 1.0 K/uL   Eosinophils Relative 4 %   Eosinophils Absolute 0.3 0.0 - 0.7 K/uL   Basophils Relative 1 %   Basophils Absolute 0.1 0.0 - 0.1 K/uL  Comprehensive metabolic panel  Result Value Ref Range    Sodium 140 135 - 145 mmol/L   Potassium 4.0 3.5 - 5.1 mmol/L   Chloride 106 101 - 111 mmol/L   CO2 28 22 - 32 mmol/L   Glucose, Bld 95 65 - 99 mg/dL   BUN 24 (H) 6 - 20 mg/dL   Creatinine, Ser 0.75 0.44 - 1.00 mg/dL   Calcium 10.2 8.9 - 10.3 mg/dL   Total Protein 7.3 6.5 - 8.1 g/dL   Albumin 4.4 3.5 -  5.0 g/dL   AST 15 15 - 41 U/L   ALT 18 14 - 54 U/L   Alkaline Phosphatase 58 38 - 126 U/L   Total Bilirubin 0.3 0.3 - 1.2 mg/dL   GFR calc non Af Amer >60 >60 mL/min   GFR calc Af Amer >60 >60 mL/min   Anion gap 6 5 - 15  Urine rapid drug screen (hosp performed)not at [facility]  Result Value Ref Range   Opiates NONE DETECTED NONE DETECTED   Cocaine NONE DETECTED NONE DETECTED   Benzodiazepines NONE DETECTED NONE DETECTED   Amphetamines NONE DETECTED NONE DETECTED   Tetrahydrocannabinol NONE DETECTED NONE DETECTED   Barbiturates NONE DETECTED NONE DETECTED  Urine microscopic-add on  Result Value Ref Range   Squamous Epithelial / LPF 0-5 (A) NONE SEEN   WBC, UA 6-30 0 - 5 WBC/hpf   RBC / HPF 0-5 0 - 5 RBC/hpf   Bacteria, UA MANY (A) NONE SEEN   Urine-Other MUCOUS PRESENT   I-Stat Chem 8, ED  (not at [facility], [facility])  Result Value Ref Range   Sodium 141 135 - 145 mmol/L   Potassium 4.0 3.5 - 5.1 mmol/L   Chloride 103 101 - 111 mmol/L   BUN 24 (H) 6 - 20 mg/dL   Creatinine, Ser 0.80 0.44 - 1.00 mg/dL   Glucose, Bld 90 65 - 99 mg/dL   Calcium, Ion 1.26 1.15 - 1.40 mmol/L   TCO2 27 0 - 100 mmol/L   Hemoglobin 13.6 12.0 - 15.0 g/dL   HCT 40.0 36.0 - 46.0 %  I-stat troponin, ED (not at [facility], [facility])  Result Value Ref Range   Troponin i, poc 0.00 0.00 - 0.08 ng/mL   Comment 3           Mr Brain Wo Contrast  Result Date: 09/07/2016 CLINICAL DATA:  70 y/o  F; gait instability and difficulty writing. EXAM: MRI HEAD WITHOUT CONTRAST TECHNIQUE: Multiplanar, multiecho pulse sequences of the brain and surrounding structures were obtained without intravenous contrast. COMPARISON:  None. FINDINGS:  Brain: Focus of diffusion restriction in the right caudate body/mid corona radiata measuring 16 x 8 x 7 mm (AP x ML x CC). Several foci of T2 FLAIR hyperintense signal abnormality in subcortical and periventricular white matter are consistent with moderate chronic microvascular ischemic changes. Mild parenchymal volume loss. Punctate focus of susceptibility hypointensity within left frontal centrum semiovale probably represents hemosiderin deposition from old microhemorrhage. Vascular: Normal flow voids. Skull and upper cervical spine: Normal marrow signal. Sinuses/Orbits: Negative. Other: None. IMPRESSION: 1. Small acute/early subacute infarct within the right caudate body/mid corona radiata. 2. Moderate chronic microvascular ischemic changes and mild parenchymal volume loss for age. These results were called by telephone at the time of interpretation on 09/07/2016 at 8:25 pm to Dr. Orlie Dakin , who verbally acknowledged these results. Electronically Signed   By: Kristine Garbe M.D.   On: 09/07/2016 20:25    Radiology No results found.  Procedures Procedures (including critical care time)  Medications Ordered in ED Medications - No data to display   Initial Impression / Assessment and Plan / ED Course  I have reviewed the triage vital signs and the nursing notes.  Pertinent labs & imaging results that were available during my care of the patient were reviewed by me and considered in my medical decision making (see chart for details).  Clinical Course  9:20 PM patient remains comfortable alert Glasgow Coma Score 15. Aspirin ordered.   Dr. Leonel Ramsay,  neurologist consulted, will see patient in hospital request transfer to [facility]. He requests admission to hospitalist service. I consulted Dr. Shanon Brow. Who will arrange transfer to [facility] under service of Dr. Loleta Books. Urine sent for culture. Oral antibiotics started Final Clinical Impressions(s) / ED Diagnoses   Diagnosis#1 subacute stroke #2 uti Final diagnoses:  None    New Prescriptions New Prescriptions   No medications on file     Orlie Dakin, MD 09/07/16 2136

## 2016-09-07 NOTE — ED Notes (Signed)
Neuron checks ordered in triage. See NIH stroke scale at [place]. Will continue future neuro checks.

## 2016-09-07 NOTE — ED Triage Notes (Signed)
Pt was seen by her PCP today who sent her here for being "off balanced". Pt has difficulty walking with a steady gait and difficulty writing that began on Monday or Tuesday. Pt also has urinary frequency that began 2 days ago. Pt has a normal neuro exam and has equal strength and sensation in all extremities. Pt denies pain anywhere. Symptoms began on Monday or Tuesday of this week

## 2016-09-07 NOTE — ED Notes (Signed)
Nuero check not completed at 2000 due to patient being in MRI.

## 2016-09-08 ENCOUNTER — Inpatient Hospital Stay (HOSPITAL_COMMUNITY): Payer: Medicare HMO

## 2016-09-08 ENCOUNTER — Encounter (HOSPITAL_COMMUNITY): Payer: Self-pay | Admitting: *Deleted

## 2016-09-08 DIAGNOSIS — Z79899 Other long term (current) drug therapy: Secondary | ICD-10-CM | POA: Diagnosis not present

## 2016-09-08 DIAGNOSIS — I639 Cerebral infarction, unspecified: Principal | ICD-10-CM

## 2016-09-08 DIAGNOSIS — E785 Hyperlipidemia, unspecified: Secondary | ICD-10-CM | POA: Diagnosis not present

## 2016-09-08 DIAGNOSIS — Z809 Family history of malignant neoplasm, unspecified: Secondary | ICD-10-CM | POA: Diagnosis not present

## 2016-09-08 DIAGNOSIS — R269 Unspecified abnormalities of gait and mobility: Secondary | ICD-10-CM | POA: Diagnosis not present

## 2016-09-08 DIAGNOSIS — Z87891 Personal history of nicotine dependence: Secondary | ICD-10-CM | POA: Diagnosis not present

## 2016-09-08 DIAGNOSIS — Z9889 Other specified postprocedural states: Secondary | ICD-10-CM | POA: Diagnosis not present

## 2016-09-08 DIAGNOSIS — Z823 Family history of stroke: Secondary | ICD-10-CM | POA: Diagnosis not present

## 2016-09-08 DIAGNOSIS — I10 Essential (primary) hypertension: Secondary | ICD-10-CM | POA: Diagnosis not present

## 2016-09-08 DIAGNOSIS — Z833 Family history of diabetes mellitus: Secondary | ICD-10-CM | POA: Diagnosis not present

## 2016-09-08 LAB — LIPID PANEL
CHOL/HDL RATIO: 3.5 ratio
Cholesterol: 196 mg/dL (ref 0–200)
HDL: 56 mg/dL (ref >40)
LDL CALC: 104 mg/dL — AB (ref 0–99)
Triglycerides: 182 mg/dL — ABNORMAL HIGH (ref <150)
VLDL: 36 mg/dL (ref 0–40)

## 2016-09-08 MED ORDER — ATORVASTATIN CALCIUM 40 MG PO TABS
40.0000 mg | ORAL_TABLET | Freq: Every day | ORAL | Status: DC
  Administered 2016-09-08 – 2016-09-09 (×2): 40 mg via ORAL
  Filled 2016-09-08 (×2): qty 1

## 2016-09-08 MED ORDER — IOPAMIDOL (ISOVUE-370) INJECTION 76%
INTRAVENOUS | Status: AC
  Administered 2016-09-08: 50 mL
  Filled 2016-09-08: qty 100

## 2016-09-08 MED ORDER — STROKE: EARLY STAGES OF RECOVERY BOOK
Freq: Once | Status: AC
  Administered 2016-09-08: 04:00:00
  Filled 2016-09-08: qty 1

## 2016-09-08 MED ORDER — ASPIRIN 325 MG PO TABS
325.0000 mg | ORAL_TABLET | Freq: Every day | ORAL | Status: DC
  Administered 2016-09-08 – 2016-09-09 (×2): 325 mg via ORAL
  Filled 2016-09-08 (×2): qty 1

## 2016-09-08 MED ORDER — ASPIRIN 300 MG RE SUPP: 300.0000 mg | Freq: Every day | RECTAL | Status: DC

## 2016-09-08 NOTE — Consult Note (Signed)
Neurology Consultation Reason for Consult: Difficult writing Referring Physician: Steward Ros  CC: Difficulty writing  History is obtained from: Patient  HPI: Rachel Douglas is a 70 y.o. left-handed female with a history of hyperlipidemia who presents with difficult writing for 4 days. She states that she knows that her hand didn't seem quite as dexterous as it did starting 4 days ago. He went to her primary care doctor to complain about this today and he referred her to the emergency room. She went to [facility] where an MRI was performed showing subcortical infarct on the right.  She denies any other symptoms other than the dexterity of her hand and even states that this seems to be improving.   LKW: 10/23 tpa given?: no, out of window   ROS: A 14 point ROS was performed and is negative except as noted in the HPI.   Past Medical History:  Diagnosis Date  . Hyperlipidemia      Family History  Problem Relation Age of Onset  . Cancer Father   . Hyperlipidemia Father   . Diabetes Father   . Stroke Father      Social History:  reports that she has quit smoking. She has never used smokeless tobacco. She reports that she does not drink alcohol or use drugs.   Exam: Current vital signs: BP 140/60 (BP Location: Right Arm)   Pulse 61   Temp 98.6 F (37 C) (Oral)   Resp 18   Ht 5\' 1"  (1.549 m)   Wt 63.7 kg (140 lb 8 oz)   SpO2 98%   BMI 26.55 kg/m  Vital signs in last 24 hours: Temp:  [97.9 F (36.6 C)-98.6 F (37 C)] 98.6 F (37 C) (10/28 0130) Pulse Rate:  [55-84] 61 (10/28 0130) Resp:  [18-21] 18 (10/28 0130) BP: (122-157)/(60-98) 140/60 (10/28 0130) SpO2:  [95 %-99 %] 98 % (10/28 0130) Weight:  [63.5 kg (140 lb)-63.7 kg (140 lb 8 oz)] 63.7 kg (140 lb 8 oz) (10/28 0130)   Physical Exam  Constitutional: Appears well-developed and well-nourished.  Psych: Affect appropriate to situation Eyes: No scleral injection HENT: No OP obstrucion Head:  Normocephalic.  Cardiovascular: Normal rate and regular rhythm.  Respiratory: Effort normal and breath sounds normal to anterior ascultation GI: Soft.  No distension. There is no tenderness.  Skin: WDI  Neuro: Mental Status: Patient is awake, alert, oriented to person, place, month, year, and situation. Patient is able to give a clear and coherent history. No signs of aphasia or neglect Cranial Nerves: II: Visual Fields are full. Pupils are equal, round, and reactive to light.   III,IV, VI: EOMI without ptosis or diploplia.  V: Facial sensation is symmetric to temperature VII: Facial movement is symmetric.  VIII: hearing is intact to voice X: Uvula elevates symmetrically XI: Shoulder shrug is symmetric. XII: tongue is midline without atrophy or fasciculations.  Motor: Tone is normal. Bulk is normal. 5/5 strength was present in all four extremities. She does have mild impairment in the dexterity of her left hand. Sensory: Sensation is symmetric to light touch and temperature in the arms and legs. Cerebellar: FNF and HKS are intact bilaterally   I have reviewed labs in epic and the results pertinent to this consultation are: CMP-unremarkable  I have reviewed the images obtained: MRI brain-subcortical infarct on the right  Impression: 70 year old female with hyperlipidemia who presents with ischemic infarct on the right. My suspicion is that this is likely: To be small vessel  disease, but she needs a full workup. She was not taking any antiplatelet therapy prior to admission, and she will need to be started on aspirin.  Recommendations: 1. HgbA1c, fasting lipid panel 2. CTA head and neck 3. Frequent neuro checks 4. Echocardiogram 5. Prophylactic therapy-Antiplatelet med: Aspirin - dose 325mg  PO or 300mg  PR 6. Risk factor modification 7. Telemetry monitoring 8. PT consult, OT consult, Speech consult 9. please page stroke NP  Or  PA  Or MD  M-F from 8am -4 pm starting 10/28 as  this patient will be followed by the stroke team at this point.   You can look them up on [email or web address]     Roland Rack, MD [facility] [phone]  If 7pm- 7am, please page neurology on call as listed in [facility].

## 2016-09-08 NOTE — Progress Notes (Signed)
STROKE TEAM PROGRESS NOTE   HISTORY OF PRESENT ILLNESS (per record) Rachel Douglas is a 70 y.o. left-handed female with a history of hyperlipidemia who presents with difficulty writing for 4 days. She states that she knows that her hand didn't seem quite as dexterous as it did starting 4 days ago. He went to her primary care doctor to complain about this today and he referred her to the emergency room. She went to [facility] where an MRI was performed showing subcortical infarct on the right.  She denies any other symptoms other than the dexterity of her hand and even states that this seems to be improving.   LKW: 10/23 tpa given?: no, out of window   SUBJECTIVE (INTERVAL HISTORY) No complaints.    MRI showed acute right corona radiata ischemic infarct.  CTA Brain and Neck were normal.  LDL 104.  She has been started on ASA since admission.   OBJECTIVE Temp:  [97.9 F (36.6 C)-98.6 F (37 C)] 98.5 F (36.9 C) (10/28 0530) Pulse Rate:  [55-84] 62 (10/28 0530) Cardiac Rhythm: Normal sinus rhythm (10/28 0128) Resp:  [16-21] 18 (10/28 0530) BP: (122-157)/(60-98) 122/63 (10/28 0530) SpO2:  [94 %-99 %] 94 % (10/28 0530) Weight:  [63.5 kg (140 lb)-63.7 kg (140 lb 8 oz)] 63.7 kg (140 lb 8 oz) (10/28 0130)  CBC:  Recent Labs Lab 09/07/16 1811 09/07/16 1823  WBC 7.5  --   NEUTROABS 3.9  --   HGB 13.6 13.6  HCT 40.5 40.0  MCV 90.8  --   PLT 334  --     Basic Metabolic Panel:  Recent Labs Lab 09/07/16 1811 09/07/16 1823  NA 140 141  K 4.0 4.0  CL 106 103  CO2 28  --   GLUCOSE 95 90  BUN 24* 24*  CREATININE 0.75 0.80  CALCIUM 10.2  --     Lipid Panel:    Component Value Date/Time   CHOL 196 09/08/2016 0332   TRIG 182 (H) 09/08/2016 0332   HDL 56 09/08/2016 0332   CHOLHDL 3.5 09/08/2016 0332   VLDL 36 09/08/2016 0332   LDLCALC 104 (H) 09/08/2016 0332   HgbA1c: No results found for: HGBA1C Urine Drug Screen:    Component Value Date/Time   LABOPIA NONE  DETECTED 09/07/2016 1630   COCAINSCRNUR NONE DETECTED 09/07/2016 1630   LABBENZ NONE DETECTED 09/07/2016 1630   AMPHETMU NONE DETECTED 09/07/2016 1630   THCU NONE DETECTED 09/07/2016 1630   LABBARB NONE DETECTED 09/07/2016 1630      IMAGING  Mr Brain Wo Contrast 09/07/2016 1. Small acute/early subacute infarct within the right caudate body/mid corona radiata.  2. Moderate chronic microvascular ischemic changes and mild parenchymal volume loss for age   CTA Head and Neck 09/08/2016 Normal CTA neck. No significant carotid or vertebral artery stenosis Negative CTA head.  No significant intracranial stenosis Extensive chronic microvascular ischemic change. Acute infarct in the deep white matter on the right.    PHYSICAL EXAM Constitutional: Appears well-developed and well-nourished.  Psych: Affect appropriate to situation Eyes: No scleral injection HENT: No OP obstrucion Head: Normocephalic.  Cardiovascular: Normal rate and regular rhythm.  Respiratory: Effort normal and breath sounds normal to anterior ascultation GI: Soft.  No distension. There is no tenderness.  Skin: WDI  Neuro: Mental Status: Patient is awake, alert, oriented to person, place, month, year, and situation. Patient is able to give a clear and coherent history. No signs of aphasia or neglect; mild dysarthria. Cranial  Nerves: II: Visual Fields are full. Pupils are equal, round, and reactive to light.   III,IV, VI: EOMI without ptosis or diploplia.  V: Facial sensation is symmetric to temperature VII: Facial movement is symmetric.  VIII: hearing is intact to voice X: Uvula elevates symmetrically XI: Shoulder shrug is symmetric. XII: tongue is midline without atrophy or fasciculations.  Motor: Tone is normal. Bulk is normal. 5/5 strength was present in all four extremities, except 4/5 grip strenght on the left.  Sensory: Sensation is symmetric to light touch and temperature in the arms and  legs. Cerebellar: FNF and HKS are intact bilaterally    ASSESSMENT/PLAN Rachel Douglas is a 69 y.o. female with history of hyperlipidemia presenting with left hand difficulties. She did not receive IV t-PA due to late presentation.   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    MRI - Small acute/early subacute infarct within the right caudate body/mid corona radiata.   MRA - not performed  CTA H&N - normal  Carotid Doppler - CTA neck  2D Echo - pending  LDL - 104  HgbA1c - pending  VTE prophylaxis - SCDs  Diet regular Room service appropriate? Yes; Fluid consistency: Thin  No antithrombotic prior to admission, now on aspirin 325 mg daily  Patient counseled to be compliant with her antithrombotic medications  Ongoing aggressive stroke risk factor management  Therapy recommendations:  No PT F/U. OT eval pending  Disposition: Pending  Hypertension  Stable  Permissive hypertension (OK if < 220/120) but gradually normalize in 5-7 days  Long-term BP goal normotensive  Hyperlipidemia  Home meds: No lipid lowering medications prior to admission  LDL 104, goal < 70  Add Lipitor 40 mg daily  Continue statin at discharge   Other Stroke Risk Factors  Advanced age  Former smoker - quit  Family hx stroke (father)   Other Active Problems  Bun - [facility] day # 1  Acute right corona radiata ischemic infarct with mild dysarthria and mild hand grip weakness.  This is small vessel ischemia related.  Cont ASA and Lipitor for secondary stroke prevention.    Will sign off.  May be discharged home from our standpoint.  Rogue Jury, MD  To contact Stroke Continuity provider, please refer to [email or web address] After hours, contact General Neurology

## 2016-09-08 NOTE — Progress Notes (Signed)
PROGRESS NOTE                                                                                                                                                                                                             Patient Demographics:    Rachel Douglas, is a 70 y.o. female, DOB - 01-Apr-1946, [id number]  Admit date - 09/07/2016   Admitting Physician Edwin Dada, MD  Outpatient Primary MD for the patient is PROVIDER NOT IN SYSTEM  LOS - 1  Outpatient Specialists:None  Chief Complaint  Patient presents with  . altered gait  . Urinary Frequency       Brief Narrative   70 year old female who is left-handed with history of hyperlipidemia presented with difficulty writing for almost 4 days. This was progressive and she went to her primary care doctor and was referred to ED. Patient was out of the therapeutic window for TPA. Head CT was unremarkable while MRI of the brain showed Small acute/early subacute infarct within the right caudate body/mid corona radiata.   Subjective:   Patient denies any symptoms. She has not tried writing today.   Assessment  & Plan :    Principal Problem:   CVA (cerebral vascular accident) ([place]) MRI findings as below. CT angiogram of the head and neck was unremarkable. LDL of 104. Patient started on aspirin and statin on admission. A1c pending. No further PT needs. 2-D echo pending. Needs aggressive secondary stroke prevention.  Active Problems:   Essential hypertension Stable. Allow permissive blood pressure.  Hyperlipidemia LDL of 104. Added Lipitor.      Code Status : Full code  Family Communication  : None at bedside  Disposition Plan  : Home in a.m. after 2-D echo.  Barriers For Discharge : Pending 2-D echo  Consults  :  Neurology  Procedures  :  MRI brain CT angiogram head and neck  DVT Prophylaxis  :  SCDs  Lab Results  Component Value Date     PLT 334 09/07/2016    Antibiotics  :  Anti-infectives    Start     Dose/Rate Route Frequency Ordered Stop   09/07/16 2145  cephALEXin (KEFLEX) capsule 500 mg     500 mg Oral  Once 09/07/16 2133 09/08/16 0053        Objective:   Vitals:  09/08/16 0530 09/08/16 0953 09/08/16 1200 09/08/16 1346  BP: 122/63 107/69 122/64 103/73  Pulse: 62 66 68 66  Resp: 18 18 18 18   Temp: 98.5 F (36.9 C) 97.9 F (36.6 C)  98.2 F (36.8 C)  TempSrc: Oral Oral  Oral  SpO2: 94% 98% 98% 95%  Weight:      Height:        Wt Readings from Last 3 Encounters:  09/08/16 63.7 kg (140 lb 8 oz)  09/07/16 63.5 kg (140 lb)  08/13/12 66.3 kg (146 lb 1.6 oz)    No intake or output data in the 24 hours ending 09/08/16 1623   Physical Exam  Gen: not in distress HEENT: moist mucosa, supple neck,  Chest: clear b/l, no added sounds CVS: N S1&S2, no murmurs, rubs or gallop GI: soft, NT, ND,  Musculoskeletal: warm, no edema CNS: AAOX3, chronic left facial droop, non focal    Data Review:    CBC  Recent Labs Lab 09/07/16 1811 09/07/16 1823  WBC 7.5  --   HGB 13.6 13.6  HCT 40.5 40.0  PLT 334  --   MCV 90.8  --   MCH 30.5  --   MCHC 33.6  --   RDW 13.0  --   LYMPHSABS 2.6  --   MONOABS 0.7  --   EOSABS 0.3  --   BASOSABS 0.1  --     Chemistries   Recent Labs Lab 09/07/16 1811 09/07/16 1823  NA 140 141  K 4.0 4.0  CL 106 103  CO2 28  --   GLUCOSE 95 90  BUN 24* 24*  CREATININE 0.75 0.80  CALCIUM 10.2  --   AST 15  --   ALT 18  --   ALKPHOS 58  --   BILITOT 0.3  --    ------------------------------------------------------------------------------------------------------------------  Recent Labs  09/08/16 0332  CHOL 196  HDL 56  LDLCALC 104*  TRIG 182*  CHOLHDL 3.5    No results found for: HGBA1C ------------------------------------------------------------------------------------------------------------------ No results for input(s): TSH, T4TOTAL, T3FREE,  THYROIDAB in the last 72 hours.  Invalid input(s): FREET3 ------------------------------------------------------------------------------------------------------------------ No results for input(s): VITAMINB12, FOLATE, FERRITIN, TIBC, IRON, RETICCTPCT in the last 72 hours.  Coagulation profile  Recent Labs Lab 09/07/16 1811  INR 0.93    No results for input(s): DDIMER in the last 72 hours.  Cardiac Enzymes No results for input(s): CKMB, TROPONINI, MYOGLOBIN in the last 168 hours.  Invalid input(s): CK ------------------------------------------------------------------------------------------------------------------ No results found for: BNP  Inpatient Medications  Scheduled Meds: . aspirin  300 mg Rectal Daily   Or  . aspirin  325 mg Oral Daily  . atorvastatin  40 mg Oral q1800   Continuous Infusions:  PRN Meds:.  Micro Results No results found for this or any previous visit (from the past 240 hour(s)).  Radiology Reports Ct Angio Head W Or Wo Contrast  Result Date: 09/08/2016 CLINICAL DATA:  Stroke EXAM: CT ANGIOGRAPHY HEAD AND NECK TECHNIQUE: Multidetector CT imaging of the head and neck was performed using the standard protocol during bolus administration of intravenous contrast. Multiplanar CT image reconstructions and MIPs were obtained to evaluate the vascular anatomy. Carotid stenosis measurements (when applicable) are obtained utilizing NASCET criteria, using the distal internal carotid diameter as the denominator. CONTRAST:  50 mL Isovue 370 IV COMPARISON:  MRI 09/07/2016 FINDINGS: CT HEAD FINDINGS Brain: Hypodensity in the deep white matter on the right compatible with acute infarct as noted on MRI. Extensive chronic  white matter changes bilaterally. No acute hemorrhage or mass. Vascular: No hyperdense vessel or unexpected calcification. Skull: Negative Sinuses: Negative Orbits: Negative Review of the MIP images confirms the above findings CTA NECK FINDINGS Aortic  arch: Minimal atherosclerotic disease in the aortic arch. Proximal great vessels widely patent. Right carotid system: Normal right carotid. Negative for carotid stenosis. No atherosclerotic disease or dissection. Left carotid system: Normal left carotid. Negative for stenosis. Negative for atherosclerotic disease or dissection. Vertebral arteries: Both vertebral arteries are patent to the basilar without significant stenosis or dissection. Skeleton: Mild cervical spine degenerative change. No acute skeletal abnormality. Other neck: Negative for mass or adenopathy Upper chest: Lung apices clear. Review of the MIP images confirms the above findings CTA HEAD FINDINGS Anterior circulation: Cavernous carotid widely patent without evidence of atherosclerotic disease or stenosis. Anterior and middle cerebral arteries are patent bilaterally without stenosis. Posterior circulation: Both vertebral arteries contribute to the basilar. PICA patent bilaterally. Basilar widely patent. AICA, superior cerebellar, posterior cerebral artery patent bilaterally. Fetal origin of the right posterior cerebral artery. Venous sinuses: Patent Anatomic variants: None Delayed phase: Normal enhancement on delayed imaging Review of the MIP images confirms the above findings IMPRESSION: Normal CTA neck. No significant carotid or vertebral artery stenosis Negative CTA head.  No significant intracranial stenosis Extensive chronic microvascular ischemic change. Acute infarct in the deep white matter on the right. Electronically Signed   By: Franchot Gallo M.D.   On: 09/08/2016 08:52   Ct Angio Neck W Or Wo Contrast  Result Date: 09/08/2016 CLINICAL DATA:  Stroke EXAM: CT ANGIOGRAPHY HEAD AND NECK TECHNIQUE: Multidetector CT imaging of the head and neck was performed using the standard protocol during bolus administration of intravenous contrast. Multiplanar CT image reconstructions and MIPs were obtained to evaluate the vascular anatomy. Carotid  stenosis measurements (when applicable) are obtained utilizing NASCET criteria, using the distal internal carotid diameter as the denominator. CONTRAST:  50 mL Isovue 370 IV COMPARISON:  MRI 09/07/2016 FINDINGS: CT HEAD FINDINGS Brain: Hypodensity in the deep white matter on the right compatible with acute infarct as noted on MRI. Extensive chronic white matter changes bilaterally. No acute hemorrhage or mass. Vascular: No hyperdense vessel or unexpected calcification. Skull: Negative Sinuses: Negative Orbits: Negative Review of the MIP images confirms the above findings CTA NECK FINDINGS Aortic arch: Minimal atherosclerotic disease in the aortic arch. Proximal great vessels widely patent. Right carotid system: Normal right carotid. Negative for carotid stenosis. No atherosclerotic disease or dissection. Left carotid system: Normal left carotid. Negative for stenosis. Negative for atherosclerotic disease or dissection. Vertebral arteries: Both vertebral arteries are patent to the basilar without significant stenosis or dissection. Skeleton: Mild cervical spine degenerative change. No acute skeletal abnormality. Other neck: Negative for mass or adenopathy Upper chest: Lung apices clear. Review of the MIP images confirms the above findings CTA HEAD FINDINGS Anterior circulation: Cavernous carotid widely patent without evidence of atherosclerotic disease or stenosis. Anterior and middle cerebral arteries are patent bilaterally without stenosis. Posterior circulation: Both vertebral arteries contribute to the basilar. PICA patent bilaterally. Basilar widely patent. AICA, superior cerebellar, posterior cerebral artery patent bilaterally. Fetal origin of the right posterior cerebral artery. Venous sinuses: Patent Anatomic variants: None Delayed phase: Normal enhancement on delayed imaging Review of the MIP images confirms the above findings IMPRESSION: Normal CTA neck. No significant carotid or vertebral artery stenosis  Negative CTA head.  No significant intracranial stenosis Extensive chronic microvascular ischemic change. Acute infarct in the deep white matter on  the right. Electronically Signed   By: Franchot Gallo M.D.   On: 09/08/2016 08:52   Mr Brain Wo Contrast  Result Date: 09/07/2016 CLINICAL DATA:  70 y/o  F; gait instability and difficulty writing. EXAM: MRI HEAD WITHOUT CONTRAST TECHNIQUE: Multiplanar, multiecho pulse sequences of the brain and surrounding structures were obtained without intravenous contrast. COMPARISON:  None. FINDINGS: Brain: Focus of diffusion restriction in the right caudate body/mid corona radiata measuring 16 x 8 x 7 mm (AP x ML x CC). Several foci of T2 FLAIR hyperintense signal abnormality in subcortical and periventricular white matter are consistent with moderate chronic microvascular ischemic changes. Mild parenchymal volume loss. Punctate focus of susceptibility hypointensity within left frontal centrum semiovale probably represents hemosiderin deposition from old microhemorrhage. Vascular: Normal flow voids. Skull and upper cervical spine: Normal marrow signal. Sinuses/Orbits: Negative. Other: None. IMPRESSION: 1. Small acute/early subacute infarct within the right caudate body/mid corona radiata. 2. Moderate chronic microvascular ischemic changes and mild parenchymal volume loss for age. These results were called by telephone at the time of interpretation on 09/07/2016 at 8:25 pm to Dr. Orlie Dakin , who verbally acknowledged these results. Electronically Signed   By: Kristine Garbe M.D.   On: 09/07/2016 20:25    Time Spent in minutes  25   Louellen Molder M.D on 09/08/2016 at 4:23 PM  Between 7am to 7pm - Pager - [phone]  After 7pm go to [email or web address] - password [facility]  [facility] -  Office  [phone]

## 2016-09-08 NOTE — Evaluation (Signed)
Speech Language Pathology Evaluation Patient Details Name: Rachel Douglas MRN: [id number] DOB: 27-Jan-1946 Today's Date: 09/08/2016 Time: [id number] SLP Time Calculation (min) (ACUTE ONLY): 31 min  Problem List:  Patient Active Problem List   Diagnosis Date Noted  . CVA (cerebral vascular accident) ([place]) 09/07/2016  . Abnormal gait 09/07/2016  . Left hand weakness 09/07/2016  . Stroke ([place]) 09/07/2016  . ANKLE PAIN, RIGHT 09/20/2009  . CLOSED FRACTURE OF LATERAL MALLEOLUS 09/20/2009   Past Medical History:  Past Medical History:  Diagnosis Date  . Hyperlipidemia    Past Surgical History:  Past Surgical History:  Procedure Laterality Date  . ECTOPIC PREGNANCY SURGERY     HPI:  70 y.o. female with medical history significant of over 3 days of having difficulty writing with her left hand it felt very hand and she was not able to hold a pen.  Her husband has also noticed some change in her walking.  No other neurological symptoms.  Her hand is feeling much better and almost back to normal.  Mri shows an acute infarct.  Pt does not take any aspirin daily and is very healthy.  [facility]uro at [facility] called and requested transfer to [place] for stroke work up   Assessment / Plan / Recommendation Clinical Impression  Pt exhibits mild cognitive deficits with organization and attention as noted on [facility] Cognitive Assessment) where she received an overall score of 23/30 with 26/30 being a typical score.  It should be noted that when time contraint was removed for 2 areas, her ability improved overall.  Her primary complaint at this time is with the accuracy of her writing. SLP provided information re: cognitive deficits and offered home health vs outpatient speech pathology services prn at d/c from hospital.    SLP Assessment  All further Speech Language Pathology  needs can be addressed in the next venue of care    Follow Up Recommendations  Outpatient SLP;Other (comment) (if family  agrees necessary )    Frequency and Duration   n/a        SLP Evaluation Cognition  Overall Cognitive Status: Within Functional Limits for tasks assessed Arousal/Alertness: Awake/alert Orientation Level: Oriented X4 Memory: Appears intact Awareness: Appears intact Problem Solving: Appears intact Behaviors: Perseveration Safety/Judgment: Appears intact       Comprehension  Auditory Comprehension Overall Auditory Comprehension: Appears within functional limits for tasks assessed Yes/No Questions: Within Functional Limits Commands: Within Functional Limits Conversation: Complex Other Conversation Comments: Pt's first language is [place], but she has lived here for 25+ yrs Visual Recognition/Discrimination Discrimination: Within Function Limits Reading Comprehension Reading Status: Within funtional limits    Expression Expression Primary Mode of Expression: Verbal Verbal Expression Overall Verbal Expression: Appears within functional limits for tasks assessed Initiation: No impairment Level of Generative/Spontaneous Verbalization: Conversation Repetition: Impaired Level of Impairment: Sentence level Naming: No impairment Pragmatics: No impairment Non-Verbal Means of Communication: Not applicable Written Expression Dominant Hand: Left Written Expression: Within Functional Limits (able to sign name and write simple sentences; visuospatial )   Oral / Motor  Oral Motor/Sensory Function Overall Oral Motor/Sensory Function: Mild impairment Facial ROM: Reduced left Facial Symmetry: Abnormal symmetry left Facial Strength: Reduced left Lingual Symmetry: Abnormal symmetry left Motor Speech Overall Motor Speech: Appears within functional limits for tasks assessed Respiration: Within functional limits Phonation: Normal Resonance: Within functional limits Articulation: Within functional limitis Intelligibility: Intelligible Motor Planning: Witnin functional limits Motor  Speech Errors: Not applicable  Functional Assessment Tool Used: NOMS Functional Limitations: Attention Attention Current Status [id number]): At least 20 percent but less than 40 percent impaired, limited or restricted Attention Goal Status [id number]): At least 20 percent but less than 40 percent impaired, limited or restricted Attention Discharge Status [phone]): At least 20 percent but less than 40 percent impaired, limited or restricted         ADAMS,PAT, M.S., CCC-SLP 09/08/2016, 4:55 PM

## 2016-09-08 NOTE — Evaluation (Signed)
Physical Therapy Evaluation and Discharge Patient Details Name: Rachel Douglas MRN: [id number] DOB: [birth year] Today's Date: 09/08/2016   History of Present Illness  Rachel Douglas is a 70 y.o. female with medical history significant of over 3 days of having difficulty writing with her left hand it felt very hand and she was not able to hold a pen.  Her husband has also noticed some change in her walking.  No other neurological symptoms.  Her hand is feeling much better and almost back to normal.  Mri shows an acute infarct.   Clinical Impression  Patient evaluated by Physical Therapy with no further acute PT needs identified. All education has been completed and the patient has no further questions. High level dynamic balance activities performed without difficulty. No fall risk at time of evaluation. Will benefit from OT for LUE dysfunction. See below for any follow-up Physical Therapy or equipment needs. PT is signing off. Thank you for this referral.     Follow Up Recommendations No PT follow up    Equipment Recommendations  None recommended by PT    Recommendations for Other Services       Precautions / Restrictions Precautions Precautions: None Restrictions Weight Bearing Restrictions: No      Mobility  Bed Mobility Overal bed mobility: Independent                Transfers Overall transfer level: Independent               General transfer comment: No difficulties  Ambulation/Gait Ambulation/Gait assistance: Independent Ambulation Distance (Feet): 200 Feet Assistive device: None Gait Pattern/deviations: WFL(Within Functional Limits) Gait velocity: WNL Gait velocity interpretation: at or above normal speed for age/gender General Gait Details: Good mechanics, tolerated higher level dynamic gait tasks without LOB. Minimal deviation from straight path during difficult tasks.  Stairs            Wheelchair Mobility    Modified Rankin (Stroke  Patients Only) Modified Rankin (Stroke Patients Only) Pre-Morbid Rankin Score: No symptoms Modified Rankin: No significant disability     Balance Overall balance assessment: Independent                               Standardized Balance Assessment Standardized Balance Assessment : Dynamic Gait Index   Dynamic Gait Index Level Surface: Normal Change in Gait Speed: Normal Gait with Horizontal Head Turns: Normal Gait with Vertical Head Turns: Normal Gait and Pivot Turn: Normal Step Over Obstacle: Normal Step Around Obstacles: Normal Steps: Moderate Impairment (Asked) Total Score: 22       Pertinent Vitals/Pain Pain Assessment: No/denies pain    Home Living Family/patient expects to be discharged to:: Private residence Living Arrangements: Spouse/significant other Available Help at Discharge: Family;Available 24 hours/day Type of Home: House Home Access: Stairs to enter Entrance Stairs-Rails: None Entrance Stairs-Number of Steps: 3 Home Layout: One level Home Equipment: Cane - single point      Prior Function Level of Independence: Independent               Hand Dominance   Dominant Hand: Left    Extremity/Trunk Assessment   Upper Extremity Assessment: LUE deficits/detail           Lower Extremity Assessment: Overall WFL for tasks assessed (no focal deficits, HKS normal)      Cervical / Trunk Assessment: Normal  Communication   Communication: No difficulties  Cognition Arousal/Alertness: Awake/alert Behavior  During Therapy: WFL for tasks assessed/performed Overall Cognitive Status: Within Functional Limits for tasks assessed                      General Comments General comments (skin integrity, edema, etc.): Discussed follow-up rehab options,  likely benefit from OT    Exercises     Assessment/Plan    PT Assessment Patent does not need any further PT services  PT Problem List            PT Treatment  Interventions      PT Goals (Current goals can be found in the Care Plan section)  Acute Rehab PT Goals Patient Stated Goal: Write again PT Goal Formulation: All assessment and education complete, DC therapy    Frequency     Barriers to discharge        Co-evaluation               End of Session   Activity Tolerance: Patient tolerated treatment well Patient left: in bed;with call bell/phone within reach;with bed alarm set Nurse Communication: Mobility status         Time: [id number] PT Time Calculation (min) (ACUTE ONLY): 21 min   Charges:   PT Evaluation $PT Eval Low Complexity: 1 Procedure     PT G CodesEllouise Newer 09/08/2016, 10:01 AM Elayne Snare, [place]

## 2016-09-08 NOTE — Progress Notes (Signed)
Patient arrived to floor via [place]. Paged admitting MD. Vitals stable. Tele placed and verified. No complaints of pain at this time. Continue to monitor.

## 2016-09-09 ENCOUNTER — Inpatient Hospital Stay (HOSPITAL_COMMUNITY): Payer: Medicare HMO

## 2016-09-09 DIAGNOSIS — I63519 Cerebral infarction due to unspecified occlusion or stenosis of unspecified middle cerebral artery: Secondary | ICD-10-CM

## 2016-09-09 DIAGNOSIS — R29898 Other symptoms and signs involving the musculoskeletal system: Secondary | ICD-10-CM

## 2016-09-09 DIAGNOSIS — I6789 Other cerebrovascular disease: Secondary | ICD-10-CM

## 2016-09-09 LAB — URINE CULTURE: Culture: NO GROWTH

## 2016-09-09 LAB — ECHOCARDIOGRAM COMPLETE
Height: 61 in
WEIGHTICAEL: 2248 [oz_av]

## 2016-09-09 LAB — HEMOGLOBIN A1C
HEMOGLOBIN A1C: 5.4 % (ref 4.8–5.6)
MEAN PLASMA GLUCOSE: 108 mg/dL

## 2016-09-09 MED ORDER — ALUM & MAG HYDROXIDE-SIMETH 200-200-20 MG/5ML PO SUSP
30.0000 mL | ORAL | Status: DC | PRN
  Administered 2016-09-09: 30 mL via ORAL
  Filled 2016-09-09: qty 30

## 2016-09-09 MED ORDER — ACETAMINOPHEN 325 MG PO TABS
650.0000 mg | ORAL_TABLET | Freq: Four times a day (QID) | ORAL | Status: DC | PRN
  Administered 2016-09-09 (×2): 650 mg via ORAL
  Filled 2016-09-09 (×2): qty 2

## 2016-09-09 MED ORDER — ASPIRIN 325 MG PO TABS: 325.0000 mg | ORAL_TABLET | Freq: Every day | ORAL | 0 refills | Status: DC

## 2016-09-09 MED ORDER — ATORVASTATIN CALCIUM 40 MG PO TABS: 40.0000 mg | ORAL_TABLET | Freq: Every day | ORAL | 0 refills | Status: DC

## 2016-09-09 NOTE — Discharge Instructions (Signed)
Stroke Prevention Some medical conditions and behaviors are associated with an increased chance of having a stroke. You may prevent a stroke by making healthy choices and managing medical conditions. HOW CAN I REDUCE MY RISK OF HAVING A STROKE?   Stay physically active. Get at least 30 minutes of activity on most or all days.  Do not smoke. It may also be helpful to avoid exposure to secondhand smoke.  Limit alcohol use. Moderate alcohol use is considered to be:  No more than 2 drinks per day for men.  No more than 1 drink per day for nonpregnant women.  Eat healthy foods. This involves:  Eating 5 or more servings of fruits and vegetables a day.  Making dietary changes that address high blood pressure (hypertension), high cholesterol, diabetes, or obesity.  Manage your cholesterol levels.  Making food choices that are high in fiber and low in saturated fat, trans fat, and cholesterol may control cholesterol levels.  Take any prescribed medicines to control cholesterol as directed by your health care provider.  Manage your diabetes.  Controlling your carbohydrate and sugar intake is recommended to manage diabetes.  Take any prescribed medicines to control diabetes as directed by your health care provider.  Control your hypertension.  Making food choices that are low in salt (sodium), saturated fat, trans fat, and cholesterol is recommended to manage hypertension.  Ask your health care provider if you need treatment to lower your blood pressure. Take any prescribed medicines to control hypertension as directed by your health care provider.  If you are 18-39 years of age, have your blood pressure checked every 3-5 years. If you are 40 years of age or older, have your blood pressure checked every year.  Maintain a healthy weight.  Reducing calorie intake and making food choices that are low in sodium, saturated fat, trans fat, and cholesterol are recommended to manage  weight.  Stop drug abuse.  Avoid taking birth control pills.  Talk to your health care provider about the risks of taking birth control pills if you are over 35 years old, smoke, get migraines, or have ever had a blood clot.  Get evaluated for sleep disorders (sleep apnea).  Talk to your health care provider about getting a sleep evaluation if you snore a lot or have excessive sleepiness.  Take medicines only as directed by your health care provider.  For some people, aspirin or blood thinners (anticoagulants) are helpful in reducing the risk of forming abnormal blood clots that can lead to stroke. If you have the irregular heart rhythm of atrial fibrillation, you should be on a blood thinner unless there is a good reason you cannot take them.  Understand all your medicine instructions.  Make sure that other conditions (such as anemia or atherosclerosis) are addressed. SEEK IMMEDIATE MEDICAL CARE IF:   You have sudden weakness or numbness of the face, arm, or leg, especially on one side of the body.  Your face or eyelid droops to one side.  You have sudden confusion.  You have trouble speaking (aphasia) or understanding.  You have sudden trouble seeing in one or both eyes.  You have sudden trouble walking.  You have dizziness.  You have a loss of balance or coordination.  You have a sudden, severe headache with no known cause.  You have new chest pain or an irregular heartbeat. Any of these symptoms may represent a serious problem that is an emergency. Do not wait to see if the symptoms will   go away. Get medical help at once. Call your local emergency services (911 in [place] Do not drive yourself to the hospital.   This information is not intended to replace advice given to you by your health care provider. Make sure you discuss any questions you have with your health care provider.   Document Released: 12/06/2004 Document Revised: 11/19/2014 Document Reviewed:  05/01/2013 Elsevier Interactive Patient Education 2016 [employer]  

## 2016-09-09 NOTE — Progress Notes (Signed)
Pt was discharged per orders from MD. Pt and spouse educated on discharge instructions. Pt and spouse verbalized understanding of instructions. All questions and concerns were addressed. Pt's IV was removed before discharge. Pt exited hospital via ambulation.

## 2016-09-09 NOTE — Discharge Summary (Signed)
Physician Discharge Summary  Rachel Douglas [id number] DOB: [birth year] DOA: 09/07/2016  PCP: Dr Melinda Crutch Admit date: 09/07/2016 Discharge date: 09/09/2016  Admitted From: Home Disposition:  Home  Recommendations for Outpatient Follow-up:  1. Follow up with PCP in 1-2 weeks 2. Follow up with Dr Erlinda Hong in 8 weeks  Home Health:none Equipment/Devices:None  Discharge Condition: Stable CODE STATUS: Full code Diet recommendation: Heart Healthy     Discharge Diagnoses:  Principal Problem:   CVA (cerebral vascular accident) ([place])   Active Problems:   Abnormal gait   Left hand weakness Hyperlipidemia  Brief narrative/history of present illness  70 year old female who is left-handed with history of hyperlipidemia presented with difficulty writing for almost 4 days. This was progressive and she went to her primary care doctor and was referred to ED. Patient was out of the therapeutic window for TPA. Head CT was unremarkable while MRI of the brain showed Small acute/early subacute infarct within the right caudate body/mid corona radiata.   Hospital course Principal Problem:   CVA (cerebral vascular accident) [facility]) MRI with acute right-sided infarct. (See details below). CT angiogram of the head and neck was unremarkable. LDL of 104. A1c of 5.4. Patient started on aspirin and statin on admission.  Symptoms completely resolved. No further PT needs. 2-D echo with normal EF, no wall motion abnormality, shows grade 2 diastolic dysfn. No cardiac source of emboli. Needs aggressive secondary stroke prevention. Follow-up with PCP and neurology as outpatient.  Active Problems:  Hyperlipidemia LDL of 104. Added Lipitor.   Consults: Neurology  Family communication: Husband at bedside  disposition: Home   Discharge Instructions  Discharge Instructions    Ambulatory referral to Neurology    Complete by:  As directed    An appointment with Dr Erlinda Hong is requested in  approximately 2 months.       Medication List    TAKE these medications   aspirin 325 MG tablet Take 1 tablet (325 mg total) by mouth daily. Start taking on:  09/10/2016   atorvastatin 40 MG tablet Commonly known as:  LIPITOR Take 1 tablet (40 mg total) by mouth daily at 6 PM.   cholecalciferol 1000 units tablet Commonly known as:  VITAMIN D Take 1,000 Units by mouth daily.   CINNAMON PO Take 1 capsule by mouth daily.   CoQ10 200 MG Caps Take 200 mg by mouth daily.   CRANBERRY PO Take 1 capsule by mouth daily.   multivitamin with minerals Tabs tablet Take 1 tablet by mouth daily.   SUMAtriptan 100 MG tablet Commonly known as:  IMITREX Take 100 mg by mouth every 2 (two) hours as needed for migraine.      Follow-up Information    Xu,Jindong, MD. Schedule an appointment as soon as possible for a visit in 2 month(s).   Specialty:  Neurology Contact information: [address] [place] [address] [phone]          Allergies  Allergen Reactions  . Codeine Other (See Comments)    Reaction:  Unknown         Procedures/Studies: Ct Angio Head W Or Wo Contrast  Result Date: 09/08/2016 CLINICAL DATA:  Stroke EXAM: CT ANGIOGRAPHY HEAD AND NECK TECHNIQUE: Multidetector CT imaging of the head and neck was performed using the standard protocol during bolus administration of intravenous contrast. Multiplanar CT image reconstructions and MIPs were obtained to evaluate the vascular anatomy. Carotid stenosis measurements (when applicable) are obtained utilizing NASCET criteria, using the distal internal  carotid diameter as the denominator. CONTRAST:  50 mL Isovue 370 IV COMPARISON:  MRI 09/07/2016 FINDINGS: CT HEAD FINDINGS Brain: Hypodensity in the deep white matter on the right compatible with acute infarct as noted on MRI. Extensive chronic white matter changes bilaterally. No acute hemorrhage or mass. Vascular: No hyperdense vessel or unexpected  calcification. Skull: Negative Sinuses: Negative Orbits: Negative Review of the MIP images confirms the above findings CTA NECK FINDINGS Aortic arch: Minimal atherosclerotic disease in the aortic arch. Proximal great vessels widely patent. Right carotid system: Normal right carotid. Negative for carotid stenosis. No atherosclerotic disease or dissection. Left carotid system: Normal left carotid. Negative for stenosis. Negative for atherosclerotic disease or dissection. Vertebral arteries: Both vertebral arteries are patent to the basilar without significant stenosis or dissection. Skeleton: Mild cervical spine degenerative change. No acute skeletal abnormality. Other neck: Negative for mass or adenopathy Upper chest: Lung apices clear. Review of the MIP images confirms the above findings CTA HEAD FINDINGS Anterior circulation: Cavernous carotid widely patent without evidence of atherosclerotic disease or stenosis. Anterior and middle cerebral arteries are patent bilaterally without stenosis. Posterior circulation: Both vertebral arteries contribute to the basilar. PICA patent bilaterally. Basilar widely patent. AICA, superior cerebellar, posterior cerebral artery patent bilaterally. Fetal origin of the right posterior cerebral artery. Venous sinuses: Patent Anatomic variants: None Delayed phase: Normal enhancement on delayed imaging Review of the MIP images confirms the above findings IMPRESSION: Normal CTA neck. No significant carotid or vertebral artery stenosis Negative CTA head.  No significant intracranial stenosis Extensive chronic microvascular ischemic change. Acute infarct in the deep white matter on the right. Electronically Signed   By: Franchot Gallo M.D.   On: 09/08/2016 08:52   Ct Angio Neck W Or Wo Contrast  Result Date: 09/08/2016 CLINICAL DATA:  Stroke EXAM: CT ANGIOGRAPHY HEAD AND NECK TECHNIQUE: Multidetector CT imaging of the head and neck was performed using the standard protocol during  bolus administration of intravenous contrast. Multiplanar CT image reconstructions and MIPs were obtained to evaluate the vascular anatomy. Carotid stenosis measurements (when applicable) are obtained utilizing NASCET criteria, using the distal internal carotid diameter as the denominator. CONTRAST:  50 mL Isovue 370 IV COMPARISON:  MRI 09/07/2016 FINDINGS: CT HEAD FINDINGS Brain: Hypodensity in the deep white matter on the right compatible with acute infarct as noted on MRI. Extensive chronic white matter changes bilaterally. No acute hemorrhage or mass. Vascular: No hyperdense vessel or unexpected calcification. Skull: Negative Sinuses: Negative Orbits: Negative Review of the MIP images confirms the above findings CTA NECK FINDINGS Aortic arch: Minimal atherosclerotic disease in the aortic arch. Proximal great vessels widely patent. Right carotid system: Normal right carotid. Negative for carotid stenosis. No atherosclerotic disease or dissection. Left carotid system: Normal left carotid. Negative for stenosis. Negative for atherosclerotic disease or dissection. Vertebral arteries: Both vertebral arteries are patent to the basilar without significant stenosis or dissection. Skeleton: Mild cervical spine degenerative change. No acute skeletal abnormality. Other neck: Negative for mass or adenopathy Upper chest: Lung apices clear. Review of the MIP images confirms the above findings CTA HEAD FINDINGS Anterior circulation: Cavernous carotid widely patent without evidence of atherosclerotic disease or stenosis. Anterior and middle cerebral arteries are patent bilaterally without stenosis. Posterior circulation: Both vertebral arteries contribute to the basilar. PICA patent bilaterally. Basilar widely patent. AICA, superior cerebellar, posterior cerebral artery patent bilaterally. Fetal origin of the right posterior cerebral artery. Venous sinuses: Patent Anatomic variants: None Delayed phase: Normal enhancement on  delayed imaging Review  of the MIP images confirms the above findings IMPRESSION: Normal CTA neck. No significant carotid or vertebral artery stenosis Negative CTA head.  No significant intracranial stenosis Extensive chronic microvascular ischemic change. Acute infarct in the deep white matter on the right. Electronically Signed   By: Franchot Gallo M.D.   On: 09/08/2016 08:52   Mr Brain Wo Contrast  Result Date: 09/07/2016 CLINICAL DATA:  70 y/o  F; gait instability and difficulty writing. EXAM: MRI HEAD WITHOUT CONTRAST TECHNIQUE: Multiplanar, multiecho pulse sequences of the brain and surrounding structures were obtained without intravenous contrast. COMPARISON:  None. FINDINGS: Brain: Focus of diffusion restriction in the right caudate body/mid corona radiata measuring 16 x 8 x 7 mm (AP x ML x CC). Several foci of T2 FLAIR hyperintense signal abnormality in subcortical and periventricular white matter are consistent with moderate chronic microvascular ischemic changes. Mild parenchymal volume loss. Punctate focus of susceptibility hypointensity within left frontal centrum semiovale probably represents hemosiderin deposition from old microhemorrhage. Vascular: Normal flow voids. Skull and upper cervical spine: Normal marrow signal. Sinuses/Orbits: Negative. Other: None. IMPRESSION: 1. Small acute/early subacute infarct within the right caudate body/mid corona radiata. 2. Moderate chronic microvascular ischemic changes and mild parenchymal volume loss for age. These results were called by telephone at the time of interpretation on 09/07/2016 at 8:25 pm to Dr. Orlie Dakin , who verbally acknowledged these results. Electronically Signed   By: Kristine Garbe M.D.   On: 09/07/2016 20:25     2d echo Study Conclusions  - Left ventricle: The cavity size was normal. Systolic function was   normal. The estimated ejection fraction was in the range of 55%   to 60%. Wall motion was normal; there  were no regional wall   motion abnormalities. Features are consistent with a pseudonormal   left ventricular filling pattern, with concomitant abnormal   relaxation and increased filling pressure (grade 2 diastolic   dysfunction). - Aortic valve: Trileaflet; normal thickness, mildly calcified   leaflets. - Mitral valve: There was mild regurgitation.  Subjective: Denies any symptoms  Discharge Exam: Vitals:   09/09/16 0500 09/09/16 1011  BP: 124/66 124/73  Pulse: 69 63  Resp: 16 18  Temp: 97.8 F (36.6 C) 98.7 F (37.1 C)   Vitals:   09/08/16 2127 09/09/16 0100 09/09/16 0500 09/09/16 1011  BP: 119/73 (!) 113/54 124/66 124/73  Pulse: 80 71 69 63  Resp: 16 16 16 18   Temp: 98 F (36.7 C) 98.2 F (36.8 C) 97.8 F (36.6 C) 98.7 F (37.1 C)  TempSrc: Axillary Oral Oral Oral  SpO2: 96% 96% 98% 99%  Weight:      Height:        Gen: not in distress HEENT: moist mucosa, supple neck,  Chest: clear b/l, no added sounds CVS: N S1&S2, no murmurs, rubs or gallop GI: soft, NT, ND,  Musculoskeletal: warm, no edema CNS: AAOX3, chronic left facial droop, non focal    The results of significant diagnostics from this hospitalization (including imaging, microbiology, ancillary and laboratory) are listed below for reference.     Microbiology: Recent Results (from the past 240 hour(s))  Urine culture     Status: None   Collection Time: 09/07/16 11:09 PM  Result Value Ref Range Status   Specimen Description URINE, CLEAN CATCH  Final   Special Requests NONE  Final   Culture NO GROWTH Performed at [facility]   Final   Report Status 09/09/2016 FINAL  Final  Labs: BNP (last 3 results) No results for input(s): BNP in the last 8760 hours. Basic Metabolic Panel:  Recent Labs Lab 09/07/16 1811 09/07/16 1823  NA 140 141  K 4.0 4.0  CL 106 103  CO2 28  --   GLUCOSE 95 90  BUN 24* 24*  CREATININE 0.75 0.80  CALCIUM 10.2  --    Liver Function Tests:  Recent  Labs Lab 09/07/16 1811  AST 15  ALT 18  ALKPHOS 58  BILITOT 0.3  PROT 7.3  ALBUMIN 4.4   No results for input(s): LIPASE, AMYLASE in the last 168 hours. No results for input(s): AMMONIA in the last 168 hours. CBC:  Recent Labs Lab 09/07/16 1811 09/07/16 1823  WBC 7.5  --   NEUTROABS 3.9  --   HGB 13.6 13.6  HCT 40.5 40.0  MCV 90.8  --   PLT 334  --    Cardiac Enzymes: No results for input(s): CKTOTAL, CKMB, CKMBINDEX, TROPONINI in the last 168 hours. BNP: Invalid input(s): POCBNP CBG: No results for input(s): GLUCAP in the last 168 hours. D-Dimer No results for input(s): DDIMER in the last 72 hours. Hgb A1c  Recent Labs  09/08/16 0332  HGBA1C 5.4   Lipid Profile  Recent Labs  09/08/16 0332  CHOL 196  HDL 56  LDLCALC 104*  TRIG 182*  CHOLHDL 3.5   Thyroid function studies No results for input(s): TSH, T4TOTAL, T3FREE, THYROIDAB in the last 72 hours.  Invalid input(s): FREET3 Anemia work up No results for input(s): VITAMINB12, FOLATE, FERRITIN, TIBC, IRON, RETICCTPCT in the last 72 hours. Urinalysis    Component Value Date/Time   COLORURINE YELLOW 09/07/2016 1630   APPEARANCEUR CLEAR 09/07/2016 1630   LABSPEC 1.012 09/07/2016 1630   PHURINE 5.5 09/07/2016 1630   GLUCOSEU NEGATIVE 09/07/2016 1630   HGBUR SMALL (A) 09/07/2016 1630   BILIRUBINUR NEGATIVE 09/07/2016 1630   KETONESUR NEGATIVE 09/07/2016 1630   PROTEINUR NEGATIVE 09/07/2016 1630   UROBILINOGEN 0.2 04/20/2008 1357   NITRITE NEGATIVE 09/07/2016 1630   LEUKOCYTESUR SMALL (A) 09/07/2016 1630   Sepsis Labs Invalid input(s): PROCALCITONIN,  WBC,  LACTICIDVEN Microbiology Recent Results (from the past 240 hour(s))  Urine culture     Status: None   Collection Time: 09/07/16 11:09 PM  Result Value Ref Range Status   Specimen Description URINE, CLEAN CATCH  Final   Special Requests NONE  Final   Culture NO GROWTH Performed at [facility]   Final   Report Status 09/09/2016  FINAL  Final     Time coordinating discharge: < 30 minutes  SIGNED:   Louellen Molder, MD  [facility] 09/09/2016, 4:19 PM Pager   If 7PM-7AM, please contact night-coverage [email or web address] Password [email or web address]

## 2016-09-09 NOTE — Progress Notes (Signed)
  Echocardiogram 2D Echocardiogram has been performed.  Darlina Sicilian M 09/09/2016, 2:27 PM

## 2016-09-10 NOTE — Progress Notes (Signed)
09/10/2016 1340: CM was left a note per ST about patient discharging home late and they had recommended some outpatient ST. CM called and spoke to Mrs Cauthorn and she currently does not feel she needs any outpatient speech therapy. CM encouraged her to call her PCP (Dr Harrington Challenger) and let them know if she changes her mind and they can arrange these services.

## 2016-09-17 DIAGNOSIS — E7800 Pure hypercholesterolemia, unspecified: Secondary | ICD-10-CM | POA: Diagnosis not present

## 2016-09-17 DIAGNOSIS — I639 Cerebral infarction, unspecified: Secondary | ICD-10-CM | POA: Diagnosis not present

## 2016-10-25 DIAGNOSIS — R350 Frequency of micturition: Secondary | ICD-10-CM | POA: Diagnosis not present

## 2016-10-25 DIAGNOSIS — N390 Urinary tract infection, site not specified: Secondary | ICD-10-CM | POA: Diagnosis not present

## 2016-11-08 DIAGNOSIS — Z6826 Body mass index (BMI) 26.0-26.9, adult: Secondary | ICD-10-CM | POA: Diagnosis not present

## 2016-11-08 DIAGNOSIS — Z0000 Encounter for general adult medical examination without abnormal findings: Secondary | ICD-10-CM | POA: Diagnosis not present

## 2016-11-21 DIAGNOSIS — R69 Illness, unspecified: Secondary | ICD-10-CM | POA: Diagnosis not present

## 2016-12-04 ENCOUNTER — Ambulatory Visit (INDEPENDENT_AMBULATORY_CARE_PROVIDER_SITE_OTHER): Payer: Medicare HMO | Admitting: Neurology

## 2016-12-04 ENCOUNTER — Encounter: Payer: Self-pay | Admitting: Neurology

## 2016-12-04 VITALS — BP 112/70 | HR 88 | Ht 60.0 in | Wt 134.4 lb

## 2016-12-04 DIAGNOSIS — E785 Hyperlipidemia, unspecified: Secondary | ICD-10-CM

## 2016-12-04 DIAGNOSIS — R29898 Other symptoms and signs involving the musculoskeletal system: Secondary | ICD-10-CM

## 2016-12-04 DIAGNOSIS — I63311 Cerebral infarction due to thrombosis of right middle cerebral artery: Secondary | ICD-10-CM | POA: Diagnosis not present

## 2016-12-04 NOTE — Progress Notes (Signed)
STROKE NEUROLOGY NEW PT NOTE  NAME: Rachel Douglas DOB: November 16, 1945  REASON FOR VISIT: stroke follow up HISTORY FROM: pt and chart  Today we had the pleasure of seeing Rachel Douglas in follow-up at our Neurology Clinic. Pt was accompanied by no one.   History Summary Ms. Rachel Douglas is a 71 y.o. left-handed female with history of hyperlipidemia was admitted on 09/07/2016 for left hand difficulties x 5 days. 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 CTA head and neck unremarkable, EF 55-60%. LDL 104 and A1c 5.4. Stroke considered small vessel events. She was put on aspirin 325 and Lipitor 40 on discharge.  Interval History During the interval time, the patient has been doing well. Her left hand weakness improved, however still has mild clumsiness on writing with left hand. She did not tolerating Lipitor due to weakness of legs, therefore Lipitor was discontinued. Continued on aspirin. BP today 112/70.  REVIEW OF SYSTEMS: Full 14 system review of systems performed and notable only for those listed below and in HPI above, all others are negative:  Constitutional:   Cardiovascular:  Ear/Nose/Throat:   Skin:  Eyes:   Respiratory:   Gastroitestinal:   Genitourinary:  Hematology/Lymphatic:   Endocrine:  Musculoskeletal:   Allergy/Immunology:   Neurological:   Psychiatric:  Sleep:   The following represents the patient's updated allergies and side effects list: Allergies  Allergen Reactions  . Codeine Other (See Comments)    Reaction:  Unknown     The neurologically relevant items on the patient's problem list were reviewed on today's visit.  Neurologic Examination  A problem focused neurological exam (12 or more points of the single system neurologic examination, vital signs counts as 1 point, cranial nerves count for 8 points) was performed.  Blood pressure 112/70, pulse 88, height 5' (1.524 m), weight 134  lb 6.4 oz (61 kg).  General - Well nourished, well developed, in no apparent distress.  Ophthalmologic - Sharp disc margins OU.   Cardiovascular - Regular rate and rhythm with no murmur.  Mental Status -  Level of arousal and orientation to time, place, and person were intact. Language including expression, naming, repetition, comprehension was assessed and found intact. Attention span and concentration were normal. Recent and remote memory were intact. Fund of Knowledge was assessed and was intact.  Cranial Nerves II - XII - II - Visual field intact OU. III, IV, VI - Extraocular movements intact. V - Facial sensation intact bilaterally. VII - Facial movement intact bilaterally. VIII - Hearing & vestibular intact bilaterally. X - Palate elevates symmetrically. XI - Chin turning & shoulder shrug intact bilaterally. XII - Tongue protrusion intact.  Motor Strength - The patient's strength was normal in all extremities except subtle left hand decreased dexterity and pronator drift was absent.  Bulk was normal and fasciculations were absent.   Motor Tone - Muscle tone was assessed at the neck and appendages and was normal.  Reflexes - The patient's reflexes were 1+ in all extremities and she had no pathological reflexes.  Sensory - Light touch, temperature/pinprick were assessed and were normal.    Coordination - The patient had normal movements in the hands and feet with no ataxia or dysmetria.  Tremor was absent.  Gait and Station - The patient's transfers, posture, gait, station, and turns were observed as normal.   Functional score  mRS = 1   0 - No symptoms.   1 - No  significant disability. Able to carry out all usual activities, despite some symptoms.   2 - Slight disability. Able to look after own affairs without assistance, but unable to carry out all previous activities.   3 - Moderate disability. Requires some help, but able to walk unassisted.   4 - Moderately  severe disability. Unable to attend to own bodily needs without assistance, and unable to walk unassisted.   5 - Severe disability. Requires constant nursing care and attention, bedridden, incontinent.   6 - Dead.   NIH Stroke Scale = 0   Data reviewed: I personally reviewed the images and agree with the radiology interpretations.  Rachel Douglas Contrast 09/07/2016 1. Small acute/early subacute infarct within the right caudate body/mid corona radiata.  2. Moderate chronic microvascular ischemic changes and mild parenchymal volume loss for age  CTA Head and Neck 09/08/2016 Normal CTA neck. No significant carotid or vertebral artery stenosis Negative CTA head. No significant intracranial stenosis Extensive chronic microvascular ischemic change. Acute infarct in the deep white matter on the right.  TTE - Left ventricle: The cavity size was normal. Systolic function was   normal. The estimated ejection fraction was in the range of 55%   to 60%. Wall motion was normal; there were no regional wall   motion abnormalities. Features are consistent with a pseudonormal   left ventricular filling pattern, with concomitant abnormal   relaxation and increased filling pressure (grade 2 diastolic   dysfunction). - Aortic valve: Trileaflet; normal thickness, mildly calcified   leaflets. - Mitral valve: There was mild regurgitation.  Component     Latest Ref Rng & Units 09/08/2016  Cholesterol     0 - 200 mg/dL 196  Triglycerides     <150 mg/dL 182 (H)  HDL Cholesterol     >40 mg/dL 56  Total CHOL/HDL Ratio     RATIO 3.5  VLDL     0 - 40 mg/dL 36  LDL (calc)     0 - 99 mg/dL 104 (H)  Hemoglobin A1C     4.8 - 5.6 % 5.4  Mean Plasma Glucose     mg/dL 108    Assessment: As you may recall, she is a 71 y.o. Caucasian female with PMH of HLD admitted on 09/07/2016 for small acute/early subacute infarct within the right caudate body/mid CR. CTA head and neck unremarkable, EF 55-60%. LDL  104 and A1c 5.4. Stroke considered small vessel events. She was put on aspirin 325 and Lipitor 40 on discharge. During the interval time, her left hand weakness improved, however still has mild clumsiness on writing with left hand. She did not tolerating Lipitor due to weakness of legs, therefore Lipitor was discontinued. Continued on aspirin.   Plan:  - continue ASA for stroke prevention - follow up with Dr. Harrington Challenger for lipid monitoring. Our goal is LDL < 70. May consider pravastatin as needed. - continue home exercise for left hand clumsiness - Follow up with your primary care physician for stroke risk factor modification. Recommend maintain blood pressure goal <130/80, diabetes with hemoglobin A1c goal below 7.0% and lipids with LDL cholesterol goal below 70 mg/dL.  - healthy diet and regular exercise  - follow up in 6 months with carolyn [facility] NP.   I spent more than 25 minutes of face to face time with the patient. Greater than 50% of time was spent in counseling and coordination of care. We discussed home history is as a result of left hand weakness, follow-up  with PCP for lipid panel monitoring, and stroke risk factor modification.   No orders of the defined types were placed in this encounter.   No orders of the defined types were placed in this encounter.   Patient Instructions  - continue ASA for stroke prevention - follow up with Dr. Harrington Challenger for lipid monitoring. Our goal is LDL < 70. We can try low intensity statin meds such as pravastatin instead of high intensity one as lipitor - continue home exercise for left hand clumsiness - Follow up with your primary care physician for stroke risk factor modification. Recommend maintain blood pressure goal <130/80, diabetes with hemoglobin A1c goal below 7.0% and lipids with LDL cholesterol goal below 70 mg/dL.  - healthy diet and regular exercise  - follow up in 6 months.    Rosalin Hawking, MD PhD [facility] [address] [place], [place] [address] [phone]

## 2016-12-04 NOTE — Patient Instructions (Signed)
-   continue ASA for stroke prevention - follow up with Dr. Harrington Challenger for lipid monitoring. Our goal is LDL < 70. We can try low intensity statin meds such as pravastatin instead of high intensity one as lipitor - continue home exercise for left hand clumsiness - Follow up with your primary care physician for stroke risk factor modification. Recommend maintain blood pressure goal <130/80, diabetes with hemoglobin A1c goal below 7.0% and lipids with LDL cholesterol goal below 70 mg/dL.  - healthy diet and regular exercise  - follow up in 6 months.

## 2016-12-28 ENCOUNTER — Other Ambulatory Visit: Payer: Self-pay | Admitting: Family Medicine

## 2016-12-28 DIAGNOSIS — Z1231 Encounter for screening mammogram for malignant neoplasm of breast: Secondary | ICD-10-CM

## 2017-02-04 ENCOUNTER — Ambulatory Visit
Admission: RE | Admit: 2017-02-04 | Discharge: 2017-02-04 | Disposition: A | Discharge status: Home / Self Care | Payer: Medicare HMO | Source: Ambulatory Visit | Attending: Family Medicine | Admitting: Family Medicine

## 2017-02-04 DIAGNOSIS — Z1231 Encounter for screening mammogram for malignant neoplasm of breast: Secondary | ICD-10-CM

## 2017-02-07 DIAGNOSIS — R350 Frequency of micturition: Secondary | ICD-10-CM | POA: Diagnosis not present

## 2017-02-07 DIAGNOSIS — Z79899 Other long term (current) drug therapy: Secondary | ICD-10-CM | POA: Diagnosis not present

## 2017-02-07 DIAGNOSIS — E7800 Pure hypercholesterolemia, unspecified: Secondary | ICD-10-CM | POA: Diagnosis not present

## 2017-03-27 DIAGNOSIS — L570 Actinic keratosis: Secondary | ICD-10-CM | POA: Diagnosis not present

## 2017-03-27 DIAGNOSIS — D229 Melanocytic nevi, unspecified: Secondary | ICD-10-CM | POA: Diagnosis not present

## 2017-03-27 DIAGNOSIS — L821 Other seborrheic keratosis: Secondary | ICD-10-CM | POA: Diagnosis not present

## 2017-04-10 DIAGNOSIS — E7800 Pure hypercholesterolemia, unspecified: Secondary | ICD-10-CM | POA: Diagnosis not present

## 2017-04-10 DIAGNOSIS — Z131 Encounter for screening for diabetes mellitus: Secondary | ICD-10-CM | POA: Diagnosis not present

## 2017-04-10 DIAGNOSIS — Z0000 Encounter for general adult medical examination without abnormal findings: Secondary | ICD-10-CM | POA: Diagnosis not present

## 2017-05-03 DIAGNOSIS — Z7982 Long term (current) use of aspirin: Secondary | ICD-10-CM | POA: Diagnosis not present

## 2017-05-03 DIAGNOSIS — Z6824 Body mass index (BMI) 24.0-24.9, adult: Secondary | ICD-10-CM | POA: Diagnosis not present

## 2017-05-03 DIAGNOSIS — Z0000 Encounter for general adult medical examination without abnormal findings: Secondary | ICD-10-CM | POA: Diagnosis not present

## 2017-05-07 DIAGNOSIS — S70361A Insect bite (nonvenomous), right thigh, initial encounter: Secondary | ICD-10-CM | POA: Diagnosis not present

## 2017-05-07 DIAGNOSIS — W57XXXA Bitten or stung by nonvenomous insect and other nonvenomous arthropods, initial encounter: Secondary | ICD-10-CM | POA: Diagnosis not present

## 2017-05-29 DIAGNOSIS — R69 Illness, unspecified: Secondary | ICD-10-CM | POA: Diagnosis not present

## 2017-06-04 ENCOUNTER — Ambulatory Visit (INDEPENDENT_AMBULATORY_CARE_PROVIDER_SITE_OTHER): Payer: Medicare HMO | Admitting: Nurse Practitioner

## 2017-06-04 ENCOUNTER — Encounter: Payer: Self-pay | Admitting: Nurse Practitioner

## 2017-06-04 VITALS — BP 137/71 | HR 54 | Wt 124.0 lb

## 2017-06-04 DIAGNOSIS — E785 Hyperlipidemia, unspecified: Secondary | ICD-10-CM

## 2017-06-04 DIAGNOSIS — I63311 Cerebral infarction due to thrombosis of right middle cerebral artery: Secondary | ICD-10-CM

## 2017-06-04 NOTE — Patient Instructions (Addendum)
Stressed the importance of management of risk factors to prevent further stroke Continue aspirin for secondary stroke prevention Maintain strict control of hypertension with blood pressure goal below 130/90, today's reading 137/71 Cholesterol with LDL cholesterol less than 70, followed by primary care,  Could not tolerate Lipitor using fish oil Exercise by walking,   eat healthy diet with whole grains,  fresh fruits and vegetables Will discharge from stroke clinic Stroke Prevention Some medical conditions and behaviors are associated with an increased chance of having a stroke. You may prevent a stroke by making healthy choices and managing medical conditions. How can I reduce my risk of having a stroke?  Stay physically active. Get at least 30 minutes of activity on most or all days.  Do not smoke. It may also be helpful to avoid exposure to secondhand smoke.  Limit alcohol use. Moderate alcohol use is considered to be: ? No more than 2 drinks per day for men. ? No more than 1 drink per day for nonpregnant women.  Eat healthy foods. This involves: ? Eating 5 or more servings of fruits and vegetables a day. ? Making dietary changes that address high blood pressure (hypertension), high cholesterol, diabetes, or obesity.  Manage your cholesterol levels. ? Making food choices that are high in fiber and low in saturated fat, trans fat, and cholesterol may control cholesterol levels. ? Take any prescribed medicines to control cholesterol as directed by your health care provider.  Manage your diabetes. ? Controlling your carbohydrate and sugar intake is recommended to manage diabetes. ? Take any prescribed medicines to control diabetes as directed by your health care provider.  Control your hypertension. ? Making food choices that are low in salt (sodium), saturated fat, trans fat, and cholesterol is recommended to manage hypertension. ? Ask your health care provider if you need treatment  to lower your blood pressure. Take any prescribed medicines to control hypertension as directed by your health care provider. ? If you are 72-5 years of age, have your blood pressure checked every 3-5 years. If you are 34 years of age or older, have your blood pressure checked every year.  Maintain a healthy weight. ? Reducing calorie intake and making food choices that are low in sodium, saturated fat, trans fat, and cholesterol are recommended to manage weight.  Stop drug abuse.  Avoid taking birth control pills. ? Talk to your health care provider about the risks of taking birth control pills if you are over 59 years old, smoke, get migraines, or have ever had a blood clot.  Get evaluated for sleep disorders (sleep apnea). ? Talk to your health care provider about getting a sleep evaluation if you snore a lot or have excessive sleepiness.  Take medicines only as directed by your health care provider. ? For some people, aspirin or blood thinners (anticoagulants) are helpful in reducing the risk of forming abnormal blood clots that can lead to stroke. If you have the irregular heart rhythm of atrial fibrillation, you should be on a blood thinner unless there is a good reason you cannot take them. ? Understand all your medicine instructions.  Make sure that other conditions (such as anemia or atherosclerosis) are addressed. Get help right away if:  You have sudden weakness or numbness of the face, arm, or leg, especially on one side of the body.  Your face or eyelid droops to one side.  You have sudden confusion.  You have trouble speaking (aphasia) or understanding.  You have  sudden trouble seeing in one or both eyes.  You have sudden trouble walking.  You have dizziness.  You have a loss of balance or coordination.  You have a sudden, severe headache with no known cause.  You have new chest pain or an irregular heartbeat. Any of these symptoms may represent a serious  problem that is an emergency. Do not wait to see if the symptoms will go away. Get medical help at once. Call your local emergency services (911 in [place] Do not drive yourself to the hospital. This information is not intended to replace advice given to you by your health care provider. Make sure you discuss any questions you have with your health care provider. Document Released: 12/06/2004 Document Revised: 04/05/2016 Document Reviewed: 05/01/2013 Elsevier Interactive Patient Education  2017 [employer].

## 2017-06-04 NOTE — Progress Notes (Signed)
[facility]  PATIENT: Rachel Douglas DOB: August 05, 1946   REASON FOR VISIT: Follow-up for stroke event October 2017 HISTORY FROM: Patient    HISTORY OF PRESENT ILLNESS:Ms.Rachel Koerber Sheltonis a 71 y.o. left-handedfemalewith history of hyperlipidemiawas admitted on 09/07/2016 for left hand difficulties x 5 days. MRIshowed small acute/early subacute infarct within the right caudate body/mid corona radiata. CTA head and neck unremarkable, EF 55-60%. LDL 104 and A1c 5.4. Stroke considered small vessel events. She was put on aspirin 325 and Lipitor 40 on discharge.  Interval History1/23/18 Dr. Erlinda Hong During the interval time, the patient has been doing well. Her left hand weakness improved, however still has mild clumsiness on writing with left hand. She did not tolerating Lipitor due to weakness of legs, therefore Lipitor was discontinued. Continued on aspirin. BP today 112/70. Update 7/24/18CM [name], 71 year old female returns for follow-up with history of stroke event October 2017. She is currently on aspirin for secondary stroke prevention without further stroke or TIA symptoms. She has no bruising or bleeding. Her mild clumsiness of the left hand has improved. She cannot tolerate Lipitor and is now on fish oil. She has altered her diet and she is in  weight watchers. She exercises by walking 30 minutes every day. Blood pressure in the office today 137/71. She has no new neurologic complaints  REVIEW OF SYSTEMS: Full 14 system review of systems performed and notable only for those listed, all others are neg:  Constitutional: neg  Cardiovascular: neg Ear/Nose/Throat: neg  Skin: neg Eyes: neg Respiratory: neg Gastroitestinal: neg  Hematology/Lymphatic: neg  Endocrine: neg Musculoskeletal:neg Allergy/Immunology: neg Neurological: neg Psychiatric: neg Sleep : neg   ALLERGIES: Allergies  Allergen Reactions  . Codeine Other (See Comments)    Reaction:   Unknown     HOME MEDICATIONS: Outpatient Medications Prior to Visit  Medication Sig Dispense Refill  . aspirin 325 MG tablet Take 1 tablet (325 mg total) by mouth daily. 30 tablet 0  . cholecalciferol (VITAMIN D) 1000 units tablet Take 1,000 Units by mouth daily.    Marland Kitchen CINNAMON PO Take 1 capsule by mouth daily.    . Coenzyme Q10 (COQ10) 200 MG CAPS Take 200 mg by mouth daily.    Marland Kitchen CRANBERRY PO Take 1 capsule by mouth daily.    . Multiple Vitamin (MULTIVITAMIN WITH MINERALS) TABS tablet Take 1 tablet by mouth daily.    . SUMAtriptan (IMITREX) 100 MG tablet Take 100 mg by mouth every 2 (two) hours as needed for migraine.     No facility-administered medications prior to visit.     PAST MEDICAL HISTORY: Past Medical History:  Diagnosis Date  . Hyperlipidemia   . Stroke [facility])     PAST SURGICAL HISTORY: Past Surgical History:  Procedure Laterality Date  . ECTOPIC PREGNANCY SURGERY      FAMILY HISTORY: Family History  Problem Relation Age of Onset  . Cancer Father   . Hyperlipidemia Father   . Diabetes Father   . Stroke Father     SOCIAL HISTORY: Social History   Social History  . Marital status: Married    Spouse name: Rachel Douglas  . Number of children: 3  . Years of education: 12   Occupational History  .      [employer]   Social History Main Topics  . Smoking status: Former Research scientist (life sciences)  . Smokeless tobacco: Never Used  . Alcohol use No  . Drug use: No  . Sexual activity: Not on file  Other Topics Concern  . Not on file   Social History Narrative   Lives with husband     PHYSICAL EXAM  Vitals:   06/04/17 0803  BP: 137/71  Pulse: (!) 54  Weight: 124 lb (56.2 kg)   Body mass index is 24.22 kg/m.  Generalized: Well developed, in no acute distress  Head: normocephalic and atraumatic,. Oropharynx benign  Neck: Supple, no carotid bruits  Cardiac: Regular rate rhythm, no murmur  Musculoskeletal: No deformity   Neurological examination    Mentation: Alert oriented to time, place, history taking. Attention span and concentration appropriate. Recent and remote memory intact.  Follows all commands speech and language fluent.   Cranial nerve II-XII: Pupils were equal round reactive to light extraocular movements were full, visual field were full on confrontational test. Facial sensation and strength were normal. hearing was intact to finger rubbing bilaterally. Uvula tongue midline. head turning and shoulder shrug were normal and symmetric.Tongue protrusion into cheek strength was normal. Motor: normal bulk and tone, full strength in the BUE, BLE, fine finger movements normal, no pronator drift. No focal weakness Sensory: normal and symmetric to light touch, pinprick, and  Vibration, in the upper and lower extremities Coordination: finger-nose-finger, heel-to-shin bilaterally, no dysmetria Reflexes: Symmetric upper and lower plantar responses were flexor bilaterally. Gait and Station: Rising up from seated position without assistance, normal stance,  moderate stride, good arm swing, smooth turning, able to perform tiptoe, and heel walking without difficulty. Tandem gait is steady. No assistive device  DIAGNOSTIC DATA (LABS, IMAGING, TESTING) - I reviewed patient records, labs, notes, testing and imaging myself where available.  Lab Results  Component Value Date   WBC 7.5 09/07/2016   HGB 13.6 09/07/2016   HCT 40.0 09/07/2016   MCV 90.8 09/07/2016   PLT 334 09/07/2016      Component Value Date/Time   NA 141 09/07/2016 1823   K 4.0 09/07/2016 1823   CL 103 09/07/2016 1823   CO2 28 09/07/2016 1811   GLUCOSE 90 09/07/2016 1823   BUN 24 (H) 09/07/2016 1823   CREATININE 0.80 09/07/2016 1823   CALCIUM 10.2 09/07/2016 1811   PROT 7.3 09/07/2016 1811   ALBUMIN 4.4 09/07/2016 1811   AST 15 09/07/2016 1811   ALT 18 09/07/2016 1811   ALKPHOS 58 09/07/2016 1811   BILITOT 0.3 09/07/2016 1811   GFRNONAA >60 09/07/2016 1811    GFRAA >60 09/07/2016 1811   Lab Results  Component Value Date   CHOL 196 09/08/2016   HDL 56 09/08/2016   LDLCALC 104 (H) 09/08/2016   TRIG 182 (H) 09/08/2016   CHOLHDL 3.5 09/08/2016   Lab Results  Component Value Date   HGBA1C 5.4 09/08/2016       ASSESSMENT AND PLAN 71 y.o. Caucasian female with PMH of HLD admitted on 09/07/2016 for small acute/early subacute infarct within the right caudate body/mid CR. CTA head and neck unremarkable, EF 55-60%. LDL 104 and A1c 5.4. Stroke considered small vessel events. She was put on aspirin 325 and Lipitor 40 on discharge. During the interval time. She did not tolerating Lipitor due to weakness of legs, therefore Lipitor was discontinued. She is taking fish oil. Continued on aspirin.   PLAN: Stressed the importance of management of risk factors to prevent further stroke Continue aspirin for secondary stroke prevention Maintain strict control of hypertension with blood pressure goal below 130/90, today's reading 137/71 Cholesterol with LDL cholesterol less than 70, followed by primary care,  Could not tolerate  Lipitor using fish oil Exercise by walking,   eat healthy diet with whole grains,  fresh fruits and vegetables has joined Weight Watchers. Will discharge from stroke clinic I spent 25 minutes in total face to face time with the patient more than 50% of which was spent counseling and coordination of care, reviewing test results reviewing medications and discussing and reviewing the diagnosis of stroke and management of risk factors, patient also given written information Dennie Bible, [facility], [facility], [facility] [address] [place], [place] [address] [phone]

## 2017-06-04 NOTE — Progress Notes (Signed)
I reviewed above note and agree with the assessment and plan.  Rosalin Hawking, MD PhD Stroke Neurology 06/04/2017 5:09 PM

## 2017-06-11 DIAGNOSIS — H524 Presbyopia: Secondary | ICD-10-CM | POA: Diagnosis not present

## 2017-06-11 DIAGNOSIS — H52229 Regular astigmatism, unspecified eye: Secondary | ICD-10-CM | POA: Diagnosis not present

## 2017-06-11 DIAGNOSIS — H5200 Hypermetropia, unspecified eye: Secondary | ICD-10-CM | POA: Diagnosis not present

## 2017-06-11 DIAGNOSIS — H5210 Myopia, unspecified eye: Secondary | ICD-10-CM | POA: Diagnosis not present

## 2017-07-01 DIAGNOSIS — R69 Illness, unspecified: Secondary | ICD-10-CM | POA: Diagnosis not present

## 2017-07-16 DIAGNOSIS — H6982 Other specified disorders of Eustachian tube, left ear: Secondary | ICD-10-CM | POA: Diagnosis not present

## 2017-10-08 DIAGNOSIS — H9202 Otalgia, left ear: Secondary | ICD-10-CM | POA: Diagnosis not present

## 2017-12-04 DIAGNOSIS — R69 Illness, unspecified: Secondary | ICD-10-CM | POA: Diagnosis not present

## 2017-12-13 DIAGNOSIS — 419620001 Death: Secondary | SNOMED CT | POA: Diagnosis not present

## 2017-12-13 DEATH — deceased

## 2017-12-31 ENCOUNTER — Other Ambulatory Visit: Payer: Self-pay | Admitting: Family Medicine

## 2017-12-31 DIAGNOSIS — Z139 Encounter for screening, unspecified: Secondary | ICD-10-CM

## 2018-01-28 ENCOUNTER — Telehealth: Payer: Self-pay | Admitting: Nurse Practitioner

## 2018-01-28 NOTE — Telephone Encounter (Signed)
Please tell her the news was most likely refer to those pt never had stroke before and have low risk for stroke. For those pts, ASA may not needed for stroke prevention. However, for those high risk patient and for patients with hx of stroke, ASA daily is needed for stroke prevention. Therefore, for her, she needs ASA daily. Do not stop. Thanks.   Rosalin Hawking, MD PhD Stroke Neurology 01/28/2018 4:56 PM

## 2018-01-28 NOTE — Telephone Encounter (Addendum)
Spoke with patient and advised her not to stop taking ASA. She stated "Okay, that's all I wanted to know. Thank you for calling."  She ended the call.  Called patient again and read her Dr Phoebe Sharps reply and advice. She stated she did not stop taking ASA, verbalized understanding of message, appreciation.

## 2018-01-28 NOTE — Telephone Encounter (Signed)
Do not stop

## 2018-01-28 NOTE — Telephone Encounter (Signed)
Pt called stating she saw on the news that "you dont have to take an Asprin a day to try to keep a stroke away" and is wanting to know if she can come off of hers or if the Dr. And NP would prefer her to keep taking it. Please call to advise

## 2018-02-05 ENCOUNTER — Ambulatory Visit
Admission: RE | Admit: 2018-02-05 | Discharge: 2018-02-05 | Disposition: A | Discharge status: Home / Self Care | Payer: Medicare HMO | Source: Ambulatory Visit | Attending: Family Medicine | Admitting: Family Medicine

## 2018-02-05 DIAGNOSIS — Z1231 Encounter for screening mammogram for malignant neoplasm of breast: Secondary | ICD-10-CM | POA: Diagnosis not present

## 2018-02-05 DIAGNOSIS — Z139 Encounter for screening, unspecified: Secondary | ICD-10-CM

## 2018-02-13 DIAGNOSIS — E7800 Pure hypercholesterolemia, unspecified: Secondary | ICD-10-CM | POA: Diagnosis not present

## 2018-02-13 DIAGNOSIS — Z131 Encounter for screening for diabetes mellitus: Secondary | ICD-10-CM | POA: Diagnosis not present

## 2018-02-17 DIAGNOSIS — Z0000 Encounter for general adult medical examination without abnormal findings: Secondary | ICD-10-CM | POA: Diagnosis not present

## 2018-02-17 DIAGNOSIS — E7800 Pure hypercholesterolemia, unspecified: Secondary | ICD-10-CM | POA: Diagnosis not present

## 2018-02-17 DIAGNOSIS — Z131 Encounter for screening for diabetes mellitus: Secondary | ICD-10-CM | POA: Diagnosis not present

## 2018-02-17 DIAGNOSIS — Z1389 Encounter for screening for other disorder: Secondary | ICD-10-CM | POA: Diagnosis not present

## 2018-04-10 DIAGNOSIS — D229 Melanocytic nevi, unspecified: Secondary | ICD-10-CM | POA: Diagnosis not present

## 2018-04-10 DIAGNOSIS — L820 Inflamed seborrheic keratosis: Secondary | ICD-10-CM | POA: Diagnosis not present

## 2018-06-25 DIAGNOSIS — R69 Illness, unspecified: Secondary | ICD-10-CM | POA: Diagnosis not present

## 2018-07-01 DIAGNOSIS — H52223 Regular astigmatism, bilateral: Secondary | ICD-10-CM | POA: Diagnosis not present

## 2018-07-01 DIAGNOSIS — H5211 Myopia, right eye: Secondary | ICD-10-CM | POA: Diagnosis not present

## 2018-07-01 DIAGNOSIS — H5202 Hypermetropia, left eye: Secondary | ICD-10-CM | POA: Diagnosis not present

## 2018-07-02 DIAGNOSIS — Z0100 Encounter for examination of eyes and vision without abnormal findings: Secondary | ICD-10-CM | POA: Diagnosis not present

## 2018-07-23 DIAGNOSIS — Z23 Encounter for immunization: Secondary | ICD-10-CM | POA: Diagnosis not present

## 2018-09-30 DIAGNOSIS — Z809 Family history of malignant neoplasm, unspecified: Secondary | ICD-10-CM | POA: Diagnosis not present

## 2018-09-30 DIAGNOSIS — Z823 Family history of stroke: Secondary | ICD-10-CM | POA: Diagnosis not present

## 2018-09-30 DIAGNOSIS — Z87891 Personal history of nicotine dependence: Secondary | ICD-10-CM | POA: Diagnosis not present

## 2018-09-30 DIAGNOSIS — Z7982 Long term (current) use of aspirin: Secondary | ICD-10-CM | POA: Diagnosis not present

## 2018-09-30 DIAGNOSIS — Z833 Family history of diabetes mellitus: Secondary | ICD-10-CM | POA: Diagnosis not present

## 2018-09-30 DIAGNOSIS — R69 Illness, unspecified: Secondary | ICD-10-CM | POA: Diagnosis not present

## 2018-12-29 ENCOUNTER — Other Ambulatory Visit: Payer: Self-pay | Admitting: Family Medicine

## 2018-12-29 DIAGNOSIS — Z1231 Encounter for screening mammogram for malignant neoplasm of breast: Secondary | ICD-10-CM

## 2019-01-06 DIAGNOSIS — R69 Illness, unspecified: Secondary | ICD-10-CM | POA: Diagnosis not present

## 2019-02-09 ENCOUNTER — Inpatient Hospital Stay: Admission: RE | Admit: 2019-02-09 | Payer: Medicare HMO | Source: Ambulatory Visit

## 2019-02-12 DIAGNOSIS — Z0000 Encounter for general adult medical examination without abnormal findings: Secondary | ICD-10-CM | POA: Diagnosis not present

## 2019-03-12 ENCOUNTER — Ambulatory Visit: Payer: Medicare HMO

## 2019-03-12 DIAGNOSIS — E7800 Pure hypercholesterolemia, unspecified: Secondary | ICD-10-CM | POA: Diagnosis not present

## 2019-03-12 DIAGNOSIS — Z131 Encounter for screening for diabetes mellitus: Secondary | ICD-10-CM | POA: Diagnosis not present

## 2019-03-12 DIAGNOSIS — Z0000 Encounter for general adult medical examination without abnormal findings: Secondary | ICD-10-CM | POA: Diagnosis not present

## 2019-04-22 ENCOUNTER — Ambulatory Visit: Payer: Medicare HMO

## 2019-05-14 ENCOUNTER — Ambulatory Visit
Admission: RE | Admit: 2019-05-14 | Discharge: 2019-05-14 | Disposition: A | Discharge status: Home / Self Care | Payer: Medicare HMO | Source: Ambulatory Visit | Attending: Family Medicine | Admitting: Family Medicine

## 2019-05-14 DIAGNOSIS — Z1231 Encounter for screening mammogram for malignant neoplasm of breast: Secondary | ICD-10-CM

## 2019-06-09 DIAGNOSIS — H5202 Hypermetropia, left eye: Secondary | ICD-10-CM | POA: Diagnosis not present

## 2019-06-09 DIAGNOSIS — S0501XD Injury of conjunctiva and corneal abrasion without foreign body, right eye, subsequent encounter: Secondary | ICD-10-CM | POA: Diagnosis not present

## 2019-06-09 DIAGNOSIS — H5211 Myopia, right eye: Secondary | ICD-10-CM | POA: Diagnosis not present

## 2019-06-09 DIAGNOSIS — H524 Presbyopia: Secondary | ICD-10-CM | POA: Diagnosis not present

## 2019-06-09 DIAGNOSIS — H52223 Regular astigmatism, bilateral: Secondary | ICD-10-CM | POA: Diagnosis not present

## 2019-07-02 DIAGNOSIS — H5211 Myopia, right eye: Secondary | ICD-10-CM | POA: Diagnosis not present

## 2019-07-02 DIAGNOSIS — H1045 Other chronic allergic conjunctivitis: Secondary | ICD-10-CM | POA: Diagnosis not present

## 2019-07-02 DIAGNOSIS — H5202 Hypermetropia, left eye: Secondary | ICD-10-CM | POA: Diagnosis not present

## 2019-07-02 DIAGNOSIS — H52223 Regular astigmatism, bilateral: Secondary | ICD-10-CM | POA: Diagnosis not present

## 2019-07-02 DIAGNOSIS — H524 Presbyopia: Secondary | ICD-10-CM | POA: Diagnosis not present

## 2019-07-14 DIAGNOSIS — R69 Illness, unspecified: Secondary | ICD-10-CM | POA: Diagnosis not present

## 2019-07-15 DIAGNOSIS — R69 Illness, unspecified: Secondary | ICD-10-CM | POA: Diagnosis not present

## 2019-07-21 DIAGNOSIS — W57XXXA Bitten or stung by nonvenomous insect and other nonvenomous arthropods, initial encounter: Secondary | ICD-10-CM | POA: Diagnosis not present

## 2019-07-21 DIAGNOSIS — S50862A Insect bite (nonvenomous) of left forearm, initial encounter: Secondary | ICD-10-CM | POA: Diagnosis not present

## 2019-08-05 DIAGNOSIS — L821 Other seborrheic keratosis: Secondary | ICD-10-CM | POA: Diagnosis not present

## 2019-08-05 DIAGNOSIS — D229 Melanocytic nevi, unspecified: Secondary | ICD-10-CM | POA: Diagnosis not present

## 2019-10-15 DIAGNOSIS — E785 Hyperlipidemia, unspecified: Secondary | ICD-10-CM | POA: Diagnosis not present

## 2019-10-15 DIAGNOSIS — Z85828 Personal history of other malignant neoplasm of skin: Secondary | ICD-10-CM | POA: Diagnosis not present

## 2019-10-15 DIAGNOSIS — Z823 Family history of stroke: Secondary | ICD-10-CM | POA: Diagnosis not present

## 2019-10-15 DIAGNOSIS — Z8673 Personal history of transient ischemic attack (TIA), and cerebral infarction without residual deficits: Secondary | ICD-10-CM | POA: Diagnosis not present

## 2019-10-15 DIAGNOSIS — Z833 Family history of diabetes mellitus: Secondary | ICD-10-CM | POA: Diagnosis not present

## 2019-10-15 DIAGNOSIS — R69 Illness, unspecified: Secondary | ICD-10-CM | POA: Diagnosis not present

## 2019-10-15 DIAGNOSIS — Z7982 Long term (current) use of aspirin: Secondary | ICD-10-CM | POA: Diagnosis not present

## 2019-10-15 DIAGNOSIS — Z8249 Family history of ischemic heart disease and other diseases of the circulatory system: Secondary | ICD-10-CM | POA: Diagnosis not present

## 2019-10-15 DIAGNOSIS — Z008 Encounter for other general examination: Secondary | ICD-10-CM | POA: Diagnosis not present

## 2019-10-15 DIAGNOSIS — Z809 Family history of malignant neoplasm, unspecified: Secondary | ICD-10-CM | POA: Diagnosis not present

## 2020-01-20 DIAGNOSIS — Z20822 Contact with and (suspected) exposure to covid-19: Secondary | ICD-10-CM | POA: Diagnosis not present

## 2020-02-15 ENCOUNTER — Other Ambulatory Visit: Payer: Self-pay | Admitting: Family Medicine

## 2020-02-15 DIAGNOSIS — E7800 Pure hypercholesterolemia, unspecified: Secondary | ICD-10-CM | POA: Diagnosis not present

## 2020-02-15 DIAGNOSIS — Z0000 Encounter for general adult medical examination without abnormal findings: Secondary | ICD-10-CM | POA: Diagnosis not present

## 2020-02-15 DIAGNOSIS — M8580 Other specified disorders of bone density and structure, unspecified site: Secondary | ICD-10-CM

## 2020-02-15 DIAGNOSIS — Z131 Encounter for screening for diabetes mellitus: Secondary | ICD-10-CM | POA: Diagnosis not present

## 2020-02-15 DIAGNOSIS — Z1231 Encounter for screening mammogram for malignant neoplasm of breast: Secondary | ICD-10-CM

## 2020-02-15 DIAGNOSIS — R69 Illness, unspecified: Secondary | ICD-10-CM | POA: Diagnosis not present

## 2020-02-18 DIAGNOSIS — R69 Illness, unspecified: Secondary | ICD-10-CM | POA: Diagnosis not present

## 2020-05-17 ENCOUNTER — Ambulatory Visit: Payer: Medicare HMO

## 2020-05-17 ENCOUNTER — Other Ambulatory Visit: Payer: Medicare HMO

## 2020-06-07 ENCOUNTER — Other Ambulatory Visit: Payer: Self-pay

## 2020-06-07 ENCOUNTER — Ambulatory Visit
Admission: RE | Admit: 2020-06-07 | Discharge: 2020-06-07 | Disposition: A | Discharge status: Home / Self Care | Payer: Medicare HMO | Source: Ambulatory Visit | Attending: Family Medicine | Admitting: Family Medicine

## 2020-06-07 DIAGNOSIS — Z780 Asymptomatic menopausal state: Secondary | ICD-10-CM | POA: Diagnosis not present

## 2020-06-07 DIAGNOSIS — M8580 Other specified disorders of bone density and structure, unspecified site: Secondary | ICD-10-CM

## 2020-06-07 DIAGNOSIS — M8589 Other specified disorders of bone density and structure, multiple sites: Secondary | ICD-10-CM | POA: Diagnosis not present

## 2020-06-07 DIAGNOSIS — Z1231 Encounter for screening mammogram for malignant neoplasm of breast: Secondary | ICD-10-CM

## 2020-07-02 DIAGNOSIS — Z885 Allergy status to narcotic agent status: Secondary | ICD-10-CM | POA: Diagnosis not present

## 2020-07-02 DIAGNOSIS — R030 Elevated blood-pressure reading, without diagnosis of hypertension: Secondary | ICD-10-CM | POA: Diagnosis not present

## 2020-07-02 DIAGNOSIS — Z8249 Family history of ischemic heart disease and other diseases of the circulatory system: Secondary | ICD-10-CM | POA: Diagnosis not present

## 2020-07-02 DIAGNOSIS — R69 Illness, unspecified: Secondary | ICD-10-CM | POA: Diagnosis not present

## 2020-07-02 DIAGNOSIS — Z85828 Personal history of other malignant neoplasm of skin: Secondary | ICD-10-CM | POA: Diagnosis not present

## 2020-07-02 DIAGNOSIS — Z87891 Personal history of nicotine dependence: Secondary | ICD-10-CM | POA: Diagnosis not present

## 2020-07-02 DIAGNOSIS — Z823 Family history of stroke: Secondary | ICD-10-CM | POA: Diagnosis not present

## 2020-07-02 DIAGNOSIS — Z833 Family history of diabetes mellitus: Secondary | ICD-10-CM | POA: Diagnosis not present

## 2020-07-02 DIAGNOSIS — Z825 Family history of asthma and other chronic lower respiratory diseases: Secondary | ICD-10-CM | POA: Diagnosis not present

## 2020-07-02 DIAGNOSIS — R32 Unspecified urinary incontinence: Secondary | ICD-10-CM | POA: Diagnosis not present

## 2020-07-06 DIAGNOSIS — R69 Illness, unspecified: Secondary | ICD-10-CM | POA: Diagnosis not present

## 2020-08-01 DIAGNOSIS — Z23 Encounter for immunization: Secondary | ICD-10-CM | POA: Diagnosis not present

## 2020-08-01 DIAGNOSIS — R002 Palpitations: Secondary | ICD-10-CM | POA: Diagnosis not present

## 2020-08-03 DIAGNOSIS — H25813 Combined forms of age-related cataract, bilateral: Secondary | ICD-10-CM | POA: Diagnosis not present

## 2020-08-03 DIAGNOSIS — H5202 Hypermetropia, left eye: Secondary | ICD-10-CM | POA: Diagnosis not present

## 2020-08-03 DIAGNOSIS — H52223 Regular astigmatism, bilateral: Secondary | ICD-10-CM | POA: Diagnosis not present

## 2020-08-03 DIAGNOSIS — D3132 Benign neoplasm of left choroid: Secondary | ICD-10-CM | POA: Diagnosis not present

## 2020-08-03 DIAGNOSIS — H5211 Myopia, right eye: Secondary | ICD-10-CM | POA: Diagnosis not present

## 2020-08-03 DIAGNOSIS — H35363 Drusen (degenerative) of macula, bilateral: Secondary | ICD-10-CM | POA: Diagnosis not present

## 2020-08-03 DIAGNOSIS — H524 Presbyopia: Secondary | ICD-10-CM | POA: Diagnosis not present

## 2020-08-03 DIAGNOSIS — H43819 Vitreous degeneration, unspecified eye: Secondary | ICD-10-CM | POA: Diagnosis not present

## 2020-08-17 DIAGNOSIS — R69 Illness, unspecified: Secondary | ICD-10-CM | POA: Diagnosis not present

## 2020-09-05 IMAGING — MG DIGITAL SCREENING BILAT W/ TOMO W/ CAD
8 series · 8 of 24 positions shown · non-contrast
Comparison: Previous exam(s).

CLINICAL DATA: Screening.

EXAM:
DIGITAL SCREENING BILATERAL MAMMOGRAM WITH TOMO AND CAD

[R MLO synth-2D]
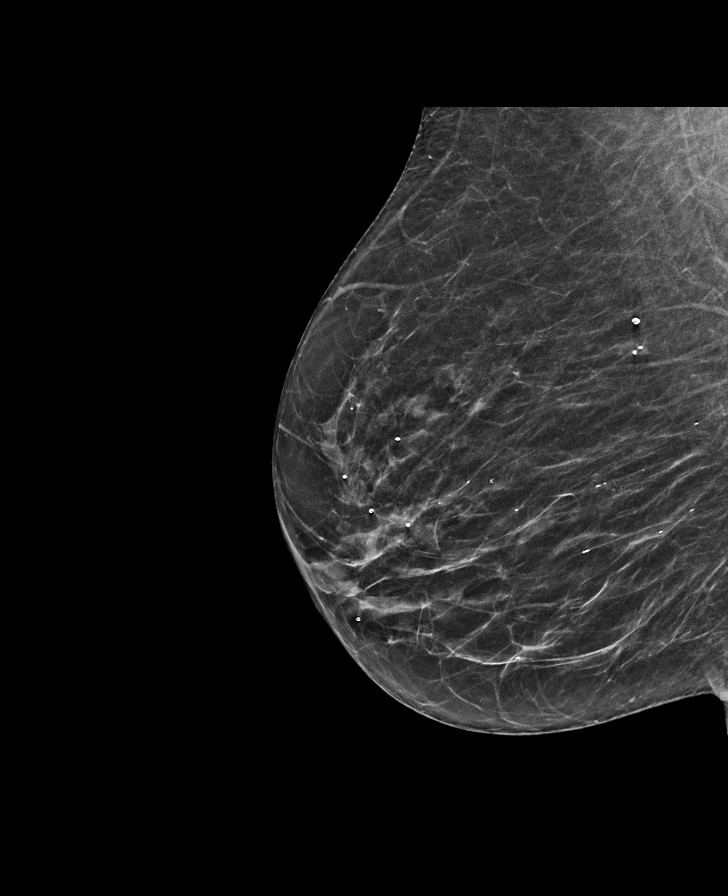

[L MLO synth-2D]
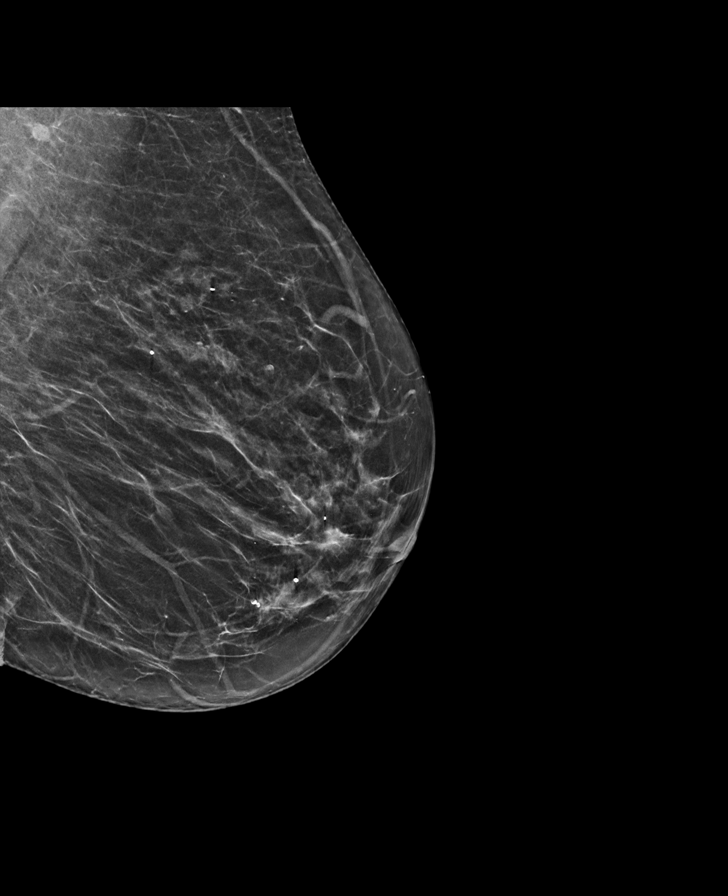

[R CC synth-2D]
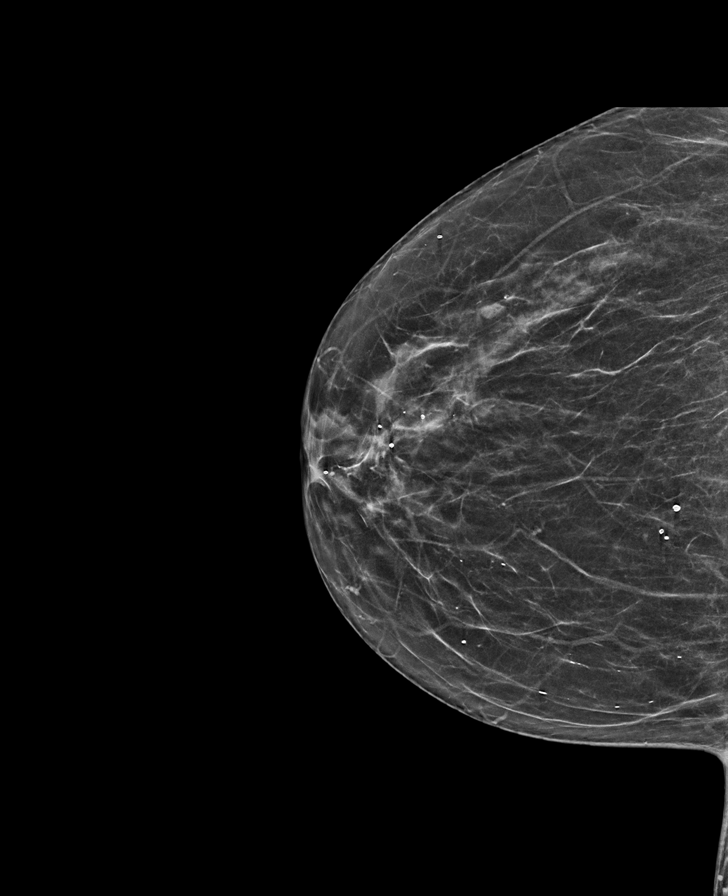

[L CC synth-2D]
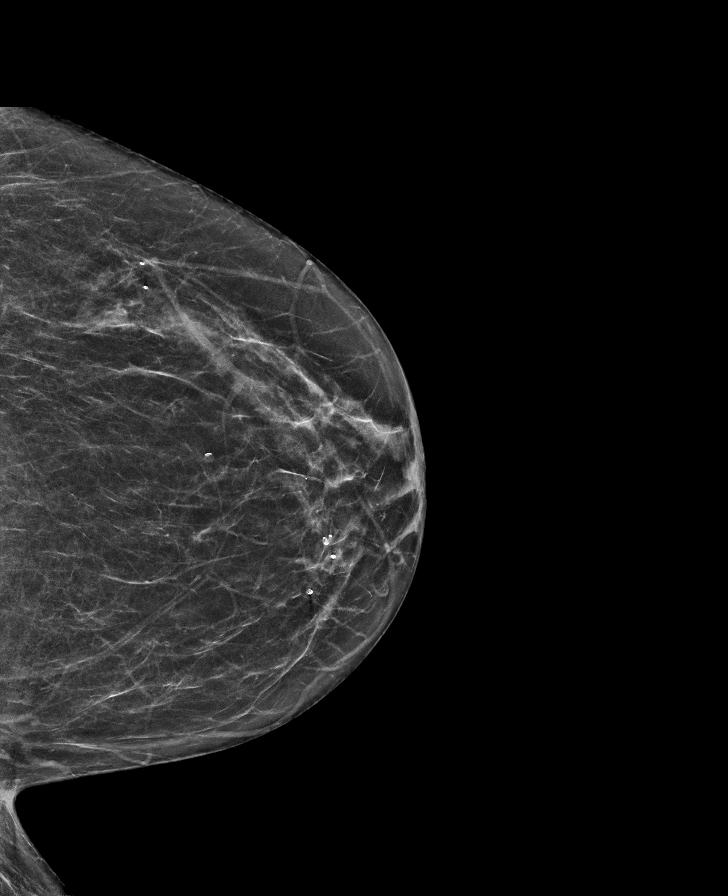

[R CC tomo · tomo slice 29/58.0]
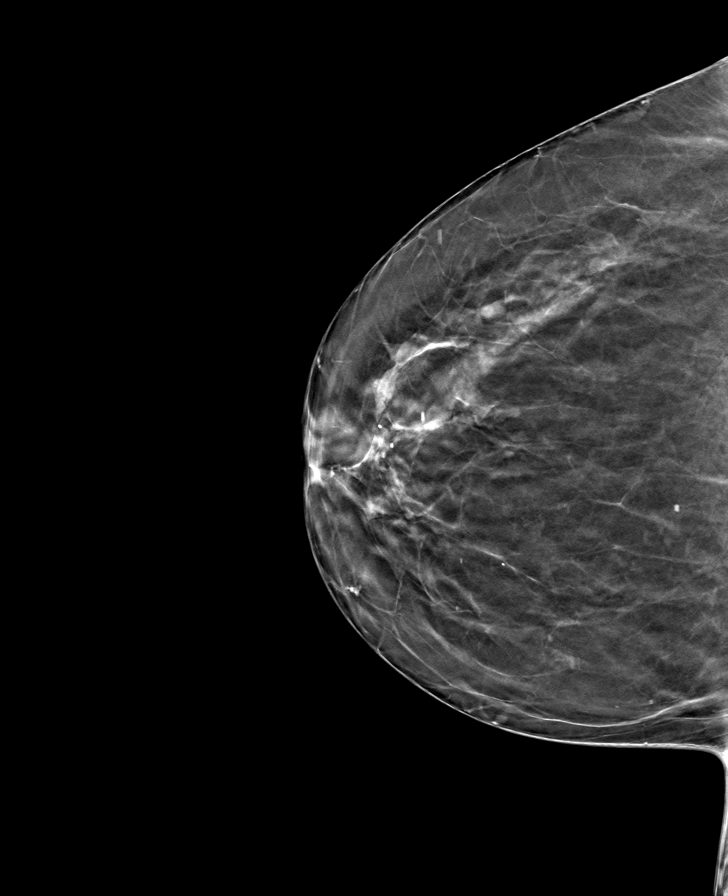

[R MLO tomo · tomo slice 29/58.0]
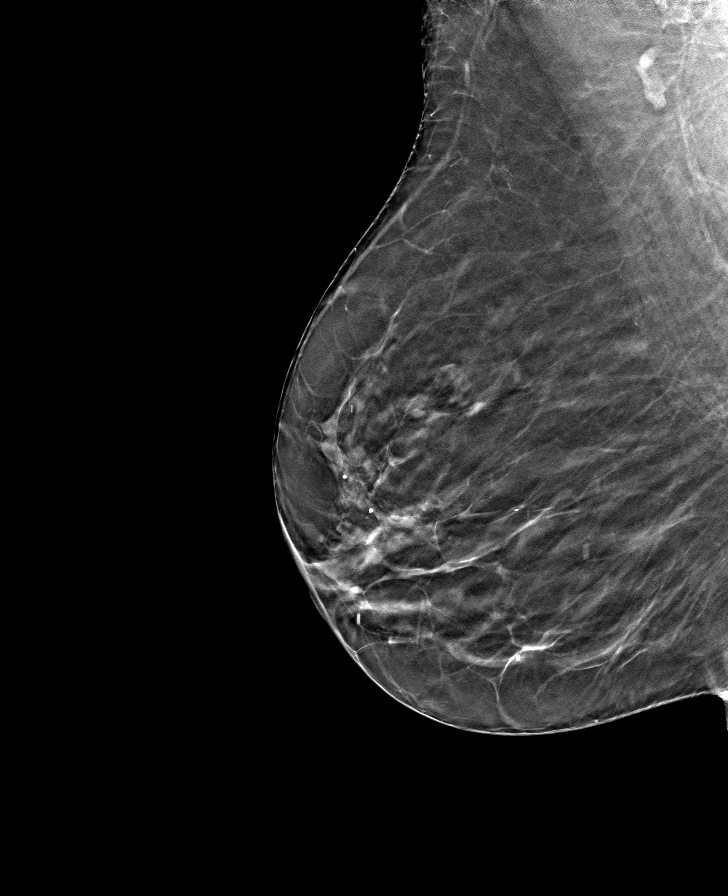

[L CC tomo · tomo slice 29/57.0]
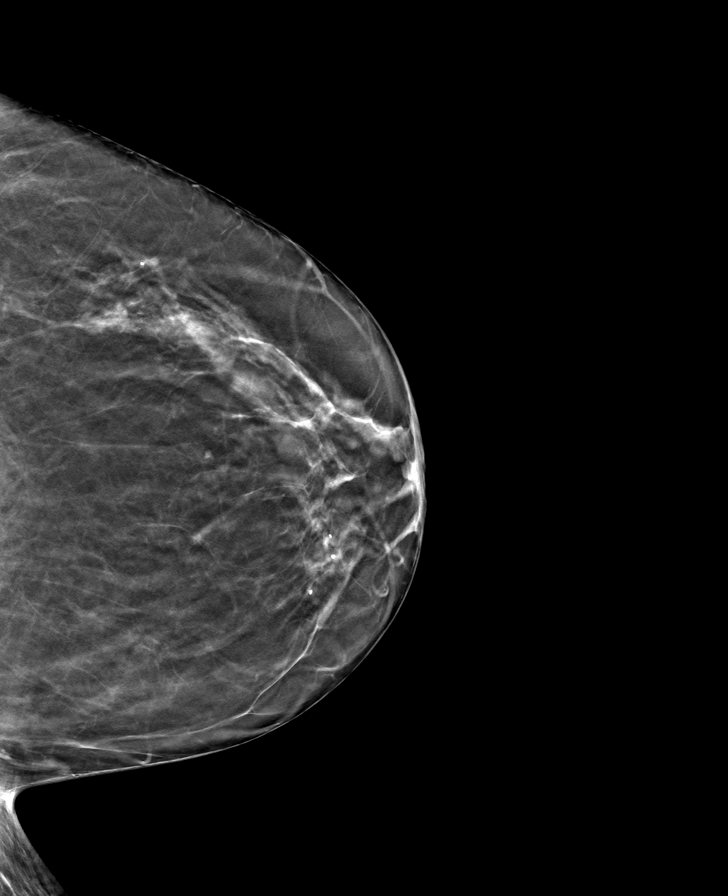

[L MLO tomo · tomo slice 30/59.0]
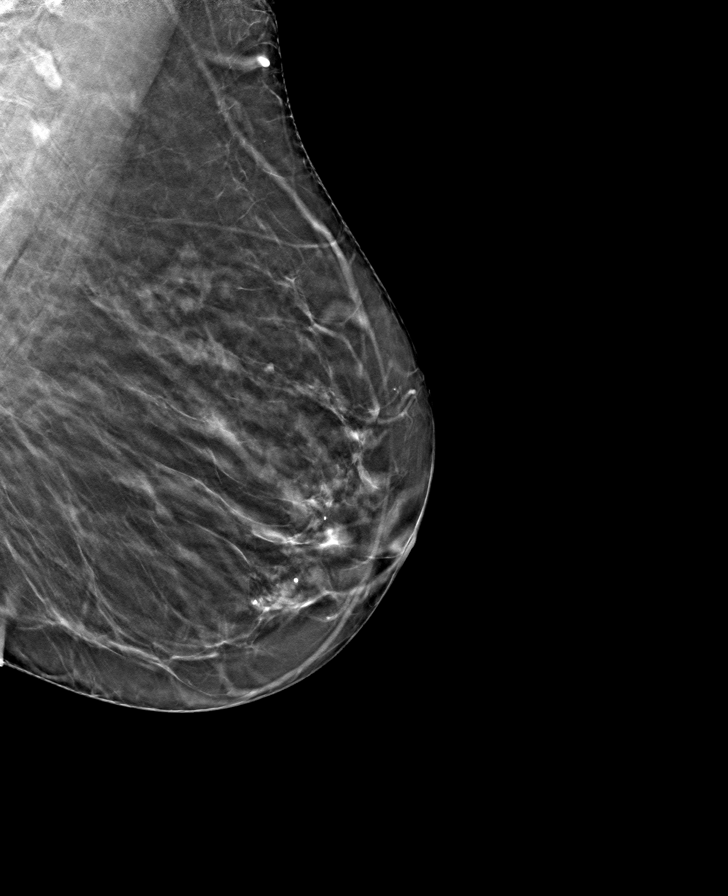

[8 of 24 positions shown; findings below may reference images not displayed]

ACR Breast Density Category b: There are scattered areas of
fibroglandular density.
FINDINGS: There are no findings suspicious for malignancy. Images were
processed with CAD.
IMPRESSION: No mammographic evidence of malignancy. A result letter of this
screening mammogram will be mailed directly to the patient.

RECOMMENDATION:
Screening mammogram in one year. (Code:[id number])

BI-RADS CATEGORY  1: Negative.

## 2020-12-08 DIAGNOSIS — Z20828 Contact with and (suspected) exposure to other viral communicable diseases: Secondary | ICD-10-CM | POA: Diagnosis not present

## 2020-12-08 DIAGNOSIS — R69 Illness, unspecified: Secondary | ICD-10-CM | POA: Diagnosis not present

## 2020-12-17 DIAGNOSIS — U071 COVID-19: Secondary | ICD-10-CM | POA: Diagnosis not present

## 2020-12-17 DIAGNOSIS — Z20822 Contact with and (suspected) exposure to covid-19: Secondary | ICD-10-CM | POA: Diagnosis not present

## 2021-02-11 DIAGNOSIS — Z8673 Personal history of transient ischemic attack (TIA), and cerebral infarction without residual deficits: Secondary | ICD-10-CM | POA: Diagnosis not present

## 2021-02-11 DIAGNOSIS — Z87891 Personal history of nicotine dependence: Secondary | ICD-10-CM | POA: Diagnosis not present

## 2021-02-11 DIAGNOSIS — R32 Unspecified urinary incontinence: Secondary | ICD-10-CM | POA: Diagnosis not present

## 2021-02-11 DIAGNOSIS — Z823 Family history of stroke: Secondary | ICD-10-CM | POA: Diagnosis not present

## 2021-02-11 DIAGNOSIS — Z833 Family history of diabetes mellitus: Secondary | ICD-10-CM | POA: Diagnosis not present

## 2021-02-11 DIAGNOSIS — Z7982 Long term (current) use of aspirin: Secondary | ICD-10-CM | POA: Diagnosis not present

## 2021-02-11 DIAGNOSIS — Z008 Encounter for other general examination: Secondary | ICD-10-CM | POA: Diagnosis not present

## 2021-02-11 DIAGNOSIS — R030 Elevated blood-pressure reading, without diagnosis of hypertension: Secondary | ICD-10-CM | POA: Diagnosis not present

## 2021-03-06 DIAGNOSIS — R3915 Urgency of urination: Secondary | ICD-10-CM | POA: Diagnosis not present

## 2021-03-06 DIAGNOSIS — R69 Illness, unspecified: Secondary | ICD-10-CM | POA: Diagnosis not present

## 2021-03-06 DIAGNOSIS — G629 Polyneuropathy, unspecified: Secondary | ICD-10-CM | POA: Diagnosis not present

## 2021-03-06 DIAGNOSIS — Z0000 Encounter for general adult medical examination without abnormal findings: Secondary | ICD-10-CM | POA: Diagnosis not present

## 2021-03-06 DIAGNOSIS — E7800 Pure hypercholesterolemia, unspecified: Secondary | ICD-10-CM | POA: Diagnosis not present

## 2021-03-14 DIAGNOSIS — R350 Frequency of micturition: Secondary | ICD-10-CM | POA: Diagnosis not present

## 2021-03-14 DIAGNOSIS — M67431 Ganglion, right wrist: Secondary | ICD-10-CM | POA: Diagnosis not present

## 2021-03-28 DIAGNOSIS — L918 Other hypertrophic disorders of the skin: Secondary | ICD-10-CM | POA: Diagnosis not present

## 2021-04-19 DIAGNOSIS — H524 Presbyopia: Secondary | ICD-10-CM | POA: Diagnosis not present

## 2021-04-19 DIAGNOSIS — H5202 Hypermetropia, left eye: Secondary | ICD-10-CM | POA: Diagnosis not present

## 2021-04-19 DIAGNOSIS — H5211 Myopia, right eye: Secondary | ICD-10-CM | POA: Diagnosis not present

## 2021-04-19 DIAGNOSIS — H52223 Regular astigmatism, bilateral: Secondary | ICD-10-CM | POA: Diagnosis not present

## 2021-04-19 DIAGNOSIS — H02825 Cysts of left lower eyelid: Secondary | ICD-10-CM | POA: Diagnosis not present

## 2021-05-03 DIAGNOSIS — H524 Presbyopia: Secondary | ICD-10-CM | POA: Diagnosis not present

## 2021-05-03 DIAGNOSIS — H0015 Chalazion left lower eyelid: Secondary | ICD-10-CM | POA: Diagnosis not present

## 2021-05-03 DIAGNOSIS — H52223 Regular astigmatism, bilateral: Secondary | ICD-10-CM | POA: Diagnosis not present

## 2021-05-03 DIAGNOSIS — H5211 Myopia, right eye: Secondary | ICD-10-CM | POA: Diagnosis not present

## 2021-05-03 DIAGNOSIS — H5202 Hypermetropia, left eye: Secondary | ICD-10-CM | POA: Diagnosis not present

## 2021-05-04 ENCOUNTER — Other Ambulatory Visit: Payer: Self-pay | Admitting: Family Medicine

## 2021-05-04 DIAGNOSIS — Z1231 Encounter for screening mammogram for malignant neoplasm of breast: Secondary | ICD-10-CM

## 2021-06-28 ENCOUNTER — Ambulatory Visit: Payer: Medicare HMO

## 2021-07-05 ENCOUNTER — Other Ambulatory Visit: Payer: Self-pay

## 2021-07-05 ENCOUNTER — Ambulatory Visit
Admission: RE | Admit: 2021-07-05 | Discharge: 2021-07-05 | Disposition: A | Discharge status: Home / Self Care | Payer: Medicare HMO | Source: Ambulatory Visit | Attending: Family Medicine | Admitting: Family Medicine

## 2021-07-05 DIAGNOSIS — Z1231 Encounter for screening mammogram for malignant neoplasm of breast: Secondary | ICD-10-CM

## 2021-07-20 DIAGNOSIS — H25813 Combined forms of age-related cataract, bilateral: Secondary | ICD-10-CM | POA: Diagnosis not present

## 2021-07-20 DIAGNOSIS — H5211 Myopia, right eye: Secondary | ICD-10-CM | POA: Diagnosis not present

## 2021-09-21 DIAGNOSIS — H25013 Cortical age-related cataract, bilateral: Secondary | ICD-10-CM | POA: Diagnosis not present

## 2021-09-21 DIAGNOSIS — H25043 Posterior subcapsular polar age-related cataract, bilateral: Secondary | ICD-10-CM | POA: Diagnosis not present

## 2021-09-21 DIAGNOSIS — H2513 Age-related nuclear cataract, bilateral: Secondary | ICD-10-CM | POA: Diagnosis not present

## 2021-09-21 DIAGNOSIS — H18413 Arcus senilis, bilateral: Secondary | ICD-10-CM | POA: Diagnosis not present

## 2021-09-21 DIAGNOSIS — H2511 Age-related nuclear cataract, right eye: Secondary | ICD-10-CM | POA: Diagnosis not present

## 2021-10-19 ENCOUNTER — Ambulatory Visit: Payer: Medicare HMO | Admitting: Physician Assistant

## 2021-10-19 ENCOUNTER — Encounter: Payer: Self-pay | Admitting: Physician Assistant

## 2021-10-19 ENCOUNTER — Other Ambulatory Visit: Payer: Self-pay

## 2021-10-19 DIAGNOSIS — Z1283 Encounter for screening for malignant neoplasm of skin: Secondary | ICD-10-CM

## 2021-10-19 DIAGNOSIS — Z85828 Personal history of other malignant neoplasm of skin: Secondary | ICD-10-CM | POA: Diagnosis not present

## 2021-10-19 DIAGNOSIS — Z86018 Personal history of other benign neoplasm: Secondary | ICD-10-CM

## 2021-10-19 DIAGNOSIS — L430 Hypertrophic lichen planus: Secondary | ICD-10-CM | POA: Diagnosis not present

## 2021-10-19 DIAGNOSIS — D485 Neoplasm of uncertain behavior of skin: Secondary | ICD-10-CM

## 2021-10-19 DIAGNOSIS — H029 Unspecified disorder of eyelid: Secondary | ICD-10-CM | POA: Diagnosis not present

## 2021-10-19 NOTE — Patient Instructions (Signed)

## 2021-10-19 NOTE — Progress Notes (Signed)
   Follow-Up Visit   Subjective  Rachel Douglas is a 75 y.o. female who presents for the following: Annual Exam (New lesion on back x months- + itch. Personal history of atypical nevi and non mole skin cancers but no melanoma. Patient denies any family history of melanoma or non mole skin cancers. ).   The following portions of the chart were reviewed this encounter and updated as appropriate:  Tobacco  Allergies  Meds  Problems  Med Hx  Surg Hx  Fam Hx      Objective  Well appearing patient in no apparent distress; mood and affect are within normal limits.  A full examination was performed including scalp, head, eyes, ears, nose, lips, neck, chest, axillae, abdomen, back, buttocks, bilateral upper extremities, bilateral lower extremities, hands, feet, fingers, toes, fingernails, and toenails. All findings within normal limits unless otherwise noted below.  Mid Back Hyperkeratotic scale with pink base        Left lateral Lower Eyelid Small pearly papule with telangectasia.       Assessment & Plan  Neoplasm of uncertain behavior of skin Mid Back  Skin / nail biopsy Type of biopsy: tangential   Informed consent: discussed and consent obtained   Timeout: patient name, date of birth, surgical site, and procedure verified   Procedure prep:  Patient was prepped and draped in usual sterile fashion (Non sterile) Prep type:  Chlorhexidine Anesthesia: the lesion was anesthetized in a standard fashion   Anesthetic:  1% lidocaine w/ epinephrine 1-100,000 local infiltration Instrument used: flexible razor blade   Outcome: patient tolerated procedure well   Post-procedure details: wound care instructions given    Specimen 1 - Surgical pathology Differential Diagnosis: bcc vs scc  Check Margins: No  Lesion of eyelid Left lateral Lower Eyelid  She is having cataract surgery in 2 weeks. I suggested removal as it appears to potentially be a basal cell carcinoma. She will  speak with her opthamologist regarding removal.     I, Veto Macqueen, PA-C, have reviewed all documentation's for this visit.  The documentation on 10/19/21 for the exam, diagnosis, procedures and orders are all accurate and complete.

## 2021-10-26 DIAGNOSIS — J069 Acute upper respiratory infection, unspecified: Secondary | ICD-10-CM | POA: Diagnosis not present

## 2021-12-06 DIAGNOSIS — H2511 Age-related nuclear cataract, right eye: Secondary | ICD-10-CM | POA: Diagnosis not present

## 2021-12-07 DIAGNOSIS — H2512 Age-related nuclear cataract, left eye: Secondary | ICD-10-CM | POA: Diagnosis not present

## 2021-12-10 DIAGNOSIS — M8580 Other specified disorders of bone density and structure, unspecified site: Secondary | ICD-10-CM | POA: Diagnosis not present

## 2021-12-10 DIAGNOSIS — R69 Illness, unspecified: Secondary | ICD-10-CM | POA: Diagnosis not present

## 2021-12-10 DIAGNOSIS — Z8249 Family history of ischemic heart disease and other diseases of the circulatory system: Secondary | ICD-10-CM | POA: Diagnosis not present

## 2021-12-10 DIAGNOSIS — H409 Unspecified glaucoma: Secondary | ICD-10-CM | POA: Diagnosis not present

## 2021-12-10 DIAGNOSIS — Z811 Family history of alcohol abuse and dependence: Secondary | ICD-10-CM | POA: Diagnosis not present

## 2021-12-10 DIAGNOSIS — G621 Alcoholic polyneuropathy: Secondary | ICD-10-CM | POA: Diagnosis not present

## 2021-12-10 DIAGNOSIS — Z823 Family history of stroke: Secondary | ICD-10-CM | POA: Diagnosis not present

## 2021-12-10 DIAGNOSIS — F419 Anxiety disorder, unspecified: Secondary | ICD-10-CM | POA: Diagnosis not present

## 2021-12-10 DIAGNOSIS — I679 Cerebrovascular disease, unspecified: Secondary | ICD-10-CM | POA: Diagnosis not present

## 2021-12-10 DIAGNOSIS — R030 Elevated blood-pressure reading, without diagnosis of hypertension: Secondary | ICD-10-CM | POA: Diagnosis not present

## 2021-12-10 DIAGNOSIS — Z7982 Long term (current) use of aspirin: Secondary | ICD-10-CM | POA: Diagnosis not present

## 2021-12-10 DIAGNOSIS — R32 Unspecified urinary incontinence: Secondary | ICD-10-CM | POA: Diagnosis not present

## 2021-12-10 DIAGNOSIS — F1021 Alcohol dependence, in remission: Secondary | ICD-10-CM | POA: Diagnosis not present

## 2021-12-15 DIAGNOSIS — Z20822 Contact with and (suspected) exposure to covid-19: Secondary | ICD-10-CM | POA: Diagnosis not present

## 2022-01-31 DIAGNOSIS — H2512 Age-related nuclear cataract, left eye: Secondary | ICD-10-CM | POA: Diagnosis not present

## 2022-03-02 DIAGNOSIS — Z0100 Encounter for examination of eyes and vision without abnormal findings: Secondary | ICD-10-CM | POA: Diagnosis not present

## 2022-03-19 DIAGNOSIS — E7800 Pure hypercholesterolemia, unspecified: Secondary | ICD-10-CM | POA: Diagnosis not present

## 2022-03-19 DIAGNOSIS — F1021 Alcohol dependence, in remission: Secondary | ICD-10-CM | POA: Diagnosis not present

## 2022-03-19 DIAGNOSIS — Z8673 Personal history of transient ischemic attack (TIA), and cerebral infarction without residual deficits: Secondary | ICD-10-CM | POA: Diagnosis not present

## 2022-03-19 DIAGNOSIS — Z131 Encounter for screening for diabetes mellitus: Secondary | ICD-10-CM | POA: Diagnosis not present

## 2022-03-19 DIAGNOSIS — R69 Illness, unspecified: Secondary | ICD-10-CM | POA: Diagnosis not present

## 2022-03-19 DIAGNOSIS — Z0000 Encounter for general adult medical examination without abnormal findings: Secondary | ICD-10-CM | POA: Diagnosis not present

## 2022-06-04 ENCOUNTER — Other Ambulatory Visit: Payer: Self-pay | Admitting: Family Medicine

## 2022-06-04 DIAGNOSIS — Z1231 Encounter for screening mammogram for malignant neoplasm of breast: Secondary | ICD-10-CM

## 2022-07-06 ENCOUNTER — Ambulatory Visit
Admission: RE | Admit: 2022-07-06 | Discharge: 2022-07-06 | Disposition: A | Discharge status: Home / Self Care | Payer: Medicare HMO | Source: Ambulatory Visit | Attending: Family Medicine | Admitting: Family Medicine

## 2022-07-06 DIAGNOSIS — Z1231 Encounter for screening mammogram for malignant neoplasm of breast: Secondary | ICD-10-CM

## 2022-07-23 DIAGNOSIS — M79661 Pain in right lower leg: Secondary | ICD-10-CM | POA: Diagnosis not present

## 2022-08-06 DIAGNOSIS — M25562 Pain in left knee: Secondary | ICD-10-CM | POA: Diagnosis not present

## 2022-09-27 DIAGNOSIS — H5203 Hypermetropia, bilateral: Secondary | ICD-10-CM | POA: Diagnosis not present

## 2022-09-27 DIAGNOSIS — H52223 Regular astigmatism, bilateral: Secondary | ICD-10-CM | POA: Diagnosis not present

## 2022-09-27 DIAGNOSIS — H04202 Unspecified epiphora, left lacrimal gland: Secondary | ICD-10-CM | POA: Diagnosis not present

## 2022-09-27 DIAGNOSIS — H26493 Other secondary cataract, bilateral: Secondary | ICD-10-CM | POA: Diagnosis not present

## 2022-09-27 DIAGNOSIS — H524 Presbyopia: Secondary | ICD-10-CM | POA: Diagnosis not present

## 2022-09-27 DIAGNOSIS — H0015 Chalazion left lower eyelid: Secondary | ICD-10-CM | POA: Diagnosis not present

## 2022-11-20 DIAGNOSIS — R69 Illness, unspecified: Secondary | ICD-10-CM | POA: Diagnosis not present

## 2022-12-18 DIAGNOSIS — M8580 Other specified disorders of bone density and structure, unspecified site: Secondary | ICD-10-CM | POA: Diagnosis not present

## 2022-12-18 DIAGNOSIS — Z833 Family history of diabetes mellitus: Secondary | ICD-10-CM | POA: Diagnosis not present

## 2022-12-18 DIAGNOSIS — R32 Unspecified urinary incontinence: Secondary | ICD-10-CM | POA: Diagnosis not present

## 2022-12-18 DIAGNOSIS — Z008 Encounter for other general examination: Secondary | ICD-10-CM | POA: Diagnosis not present

## 2022-12-18 DIAGNOSIS — Z809 Family history of malignant neoplasm, unspecified: Secondary | ICD-10-CM | POA: Diagnosis not present

## 2022-12-18 DIAGNOSIS — E785 Hyperlipidemia, unspecified: Secondary | ICD-10-CM | POA: Diagnosis not present

## 2022-12-18 DIAGNOSIS — Z8673 Personal history of transient ischemic attack (TIA), and cerebral infarction without residual deficits: Secondary | ICD-10-CM | POA: Diagnosis not present

## 2022-12-18 DIAGNOSIS — Z634 Disappearance and death of family member: Secondary | ICD-10-CM | POA: Diagnosis not present

## 2022-12-18 DIAGNOSIS — Z85828 Personal history of other malignant neoplasm of skin: Secondary | ICD-10-CM | POA: Diagnosis not present

## 2022-12-18 DIAGNOSIS — Z8249 Family history of ischemic heart disease and other diseases of the circulatory system: Secondary | ICD-10-CM | POA: Diagnosis not present

## 2022-12-18 DIAGNOSIS — R69 Illness, unspecified: Secondary | ICD-10-CM | POA: Diagnosis not present

## 2023-01-15 DIAGNOSIS — Z961 Presence of intraocular lens: Secondary | ICD-10-CM | POA: Diagnosis not present

## 2023-01-15 DIAGNOSIS — H18413 Arcus senilis, bilateral: Secondary | ICD-10-CM | POA: Diagnosis not present

## 2023-01-15 DIAGNOSIS — H33002 Unspecified retinal detachment with retinal break, left eye: Secondary | ICD-10-CM | POA: Diagnosis not present

## 2023-01-15 DIAGNOSIS — H33192 Other retinoschisis and retinal cysts, left eye: Secondary | ICD-10-CM | POA: Diagnosis not present

## 2023-01-15 DIAGNOSIS — H26493 Other secondary cataract, bilateral: Secondary | ICD-10-CM | POA: Diagnosis not present

## 2023-01-21 DIAGNOSIS — F1021 Alcohol dependence, in remission: Secondary | ICD-10-CM | POA: Diagnosis not present

## 2023-01-21 DIAGNOSIS — R030 Elevated blood-pressure reading, without diagnosis of hypertension: Secondary | ICD-10-CM | POA: Diagnosis not present

## 2023-01-21 DIAGNOSIS — Z6824 Body mass index (BMI) 24.0-24.9, adult: Secondary | ICD-10-CM | POA: Diagnosis not present

## 2023-01-21 DIAGNOSIS — F4321 Adjustment disorder with depressed mood: Secondary | ICD-10-CM | POA: Diagnosis not present

## 2023-01-21 DIAGNOSIS — R69 Illness, unspecified: Secondary | ICD-10-CM | POA: Diagnosis not present

## 2023-01-22 DIAGNOSIS — H26492 Other secondary cataract, left eye: Secondary | ICD-10-CM | POA: Diagnosis not present

## 2023-01-30 DIAGNOSIS — Z961 Presence of intraocular lens: Secondary | ICD-10-CM | POA: Diagnosis not present

## 2023-01-30 DIAGNOSIS — H2512 Age-related nuclear cataract, left eye: Secondary | ICD-10-CM | POA: Diagnosis not present

## 2023-01-30 DIAGNOSIS — Z9842 Cataract extraction status, left eye: Secondary | ICD-10-CM | POA: Diagnosis not present

## 2023-02-07 DIAGNOSIS — Z0100 Encounter for examination of eyes and vision without abnormal findings: Secondary | ICD-10-CM | POA: Diagnosis not present

## 2023-02-08 DIAGNOSIS — R69 Illness, unspecified: Secondary | ICD-10-CM | POA: Diagnosis not present

## 2023-02-12 DIAGNOSIS — H26491 Other secondary cataract, right eye: Secondary | ICD-10-CM | POA: Diagnosis not present

## 2023-02-20 DIAGNOSIS — H5203 Hypermetropia, bilateral: Secondary | ICD-10-CM | POA: Diagnosis not present

## 2023-02-20 DIAGNOSIS — H524 Presbyopia: Secondary | ICD-10-CM | POA: Diagnosis not present

## 2023-02-20 DIAGNOSIS — Z961 Presence of intraocular lens: Secondary | ICD-10-CM | POA: Diagnosis not present

## 2023-02-20 DIAGNOSIS — Z9841 Cataract extraction status, right eye: Secondary | ICD-10-CM | POA: Diagnosis not present

## 2023-02-20 DIAGNOSIS — H52223 Regular astigmatism, bilateral: Secondary | ICD-10-CM | POA: Diagnosis not present

## 2023-03-05 DIAGNOSIS — R69 Illness, unspecified: Secondary | ICD-10-CM | POA: Diagnosis not present

## 2023-03-18 DIAGNOSIS — B079 Viral wart, unspecified: Secondary | ICD-10-CM | POA: Diagnosis not present

## 2023-03-18 DIAGNOSIS — H33192 Other retinoschisis and retinal cysts, left eye: Secondary | ICD-10-CM | POA: Diagnosis not present

## 2023-04-01 ENCOUNTER — Other Ambulatory Visit: Payer: Self-pay | Admitting: Family Medicine

## 2023-04-01 DIAGNOSIS — Z1231 Encounter for screening mammogram for malignant neoplasm of breast: Secondary | ICD-10-CM

## 2023-04-01 DIAGNOSIS — Z6824 Body mass index (BMI) 24.0-24.9, adult: Secondary | ICD-10-CM | POA: Diagnosis not present

## 2023-04-01 DIAGNOSIS — E7800 Pure hypercholesterolemia, unspecified: Secondary | ICD-10-CM | POA: Diagnosis not present

## 2023-04-01 DIAGNOSIS — Z131 Encounter for screening for diabetes mellitus: Secondary | ICD-10-CM | POA: Diagnosis not present

## 2023-04-01 DIAGNOSIS — Z0000 Encounter for general adult medical examination without abnormal findings: Secondary | ICD-10-CM | POA: Diagnosis not present

## 2023-05-02 DIAGNOSIS — Z9849 Cataract extraction status, unspecified eye: Secondary | ICD-10-CM | POA: Diagnosis not present

## 2023-05-02 DIAGNOSIS — Z961 Presence of intraocular lens: Secondary | ICD-10-CM | POA: Diagnosis not present

## 2023-05-14 DIAGNOSIS — R69 Illness, unspecified: Secondary | ICD-10-CM | POA: Diagnosis not present

## 2023-06-04 DIAGNOSIS — R69 Illness, unspecified: Secondary | ICD-10-CM | POA: Diagnosis not present

## 2023-06-06 DIAGNOSIS — D2262 Melanocytic nevi of left upper limb, including shoulder: Secondary | ICD-10-CM | POA: Diagnosis not present

## 2023-06-06 DIAGNOSIS — L821 Other seborrheic keratosis: Secondary | ICD-10-CM | POA: Diagnosis not present

## 2023-06-06 DIAGNOSIS — D1801 Hemangioma of skin and subcutaneous tissue: Secondary | ICD-10-CM | POA: Diagnosis not present

## 2023-06-06 DIAGNOSIS — L814 Other melanin hyperpigmentation: Secondary | ICD-10-CM | POA: Diagnosis not present

## 2023-06-13 DIAGNOSIS — R69 Illness, unspecified: Secondary | ICD-10-CM | POA: Diagnosis not present

## 2023-06-26 DIAGNOSIS — R739 Hyperglycemia, unspecified: Secondary | ICD-10-CM | POA: Diagnosis not present

## 2023-06-26 DIAGNOSIS — Z6824 Body mass index (BMI) 24.0-24.9, adult: Secondary | ICD-10-CM | POA: Diagnosis not present

## 2023-07-04 DIAGNOSIS — R69 Illness, unspecified: Secondary | ICD-10-CM | POA: Diagnosis not present

## 2023-07-08 ENCOUNTER — Ambulatory Visit: Admission: RE | Admit: 2023-07-08 | Payer: Medicare HMO | Source: Ambulatory Visit

## 2023-07-08 DIAGNOSIS — Z1231 Encounter for screening mammogram for malignant neoplasm of breast: Secondary | ICD-10-CM

## 2023-09-03 DIAGNOSIS — H53143 Visual discomfort, bilateral: Secondary | ICD-10-CM | POA: Diagnosis not present

## 2023-09-03 DIAGNOSIS — H02055 Trichiasis without entropian left lower eyelid: Secondary | ICD-10-CM | POA: Diagnosis not present

## 2023-09-03 DIAGNOSIS — H04211 Epiphora due to excess lacrimation, right lacrimal gland: Secondary | ICD-10-CM | POA: Diagnosis not present

## 2023-09-03 DIAGNOSIS — H04201 Unspecified epiphora, right lacrimal gland: Secondary | ICD-10-CM | POA: Diagnosis not present

## 2023-09-17 DIAGNOSIS — D122 Benign neoplasm of ascending colon: Secondary | ICD-10-CM | POA: Diagnosis not present

## 2023-09-17 DIAGNOSIS — D123 Benign neoplasm of transverse colon: Secondary | ICD-10-CM | POA: Diagnosis not present

## 2023-09-17 DIAGNOSIS — K5730 Diverticulosis of large intestine without perforation or abscess without bleeding: Secondary | ICD-10-CM | POA: Diagnosis not present

## 2023-09-17 DIAGNOSIS — Z800 Family history of malignant neoplasm of digestive organs: Secondary | ICD-10-CM | POA: Diagnosis not present

## 2023-09-17 DIAGNOSIS — D125 Benign neoplasm of sigmoid colon: Secondary | ICD-10-CM | POA: Diagnosis not present

## 2023-09-17 DIAGNOSIS — Z09 Encounter for follow-up examination after completed treatment for conditions other than malignant neoplasm: Secondary | ICD-10-CM | POA: Diagnosis not present

## 2023-09-17 DIAGNOSIS — Z860101 Personal history of adenomatous and serrated colon polyps: Secondary | ICD-10-CM | POA: Diagnosis not present

## 2023-09-19 DIAGNOSIS — D123 Benign neoplasm of transverse colon: Secondary | ICD-10-CM | POA: Diagnosis not present

## 2023-09-19 DIAGNOSIS — D122 Benign neoplasm of ascending colon: Secondary | ICD-10-CM | POA: Diagnosis not present

## 2023-09-19 DIAGNOSIS — D125 Benign neoplasm of sigmoid colon: Secondary | ICD-10-CM | POA: Diagnosis not present

## 2023-10-03 DIAGNOSIS — H6123 Impacted cerumen, bilateral: Secondary | ICD-10-CM | POA: Diagnosis not present

## 2023-10-03 NOTE — Progress Notes (Signed)
 Otolaryngology Clinic Note  HPI:    Cerumen Impaction  Rachel Douglas is a 77 y.o. female who presents as a new patient, referred by self, for evaluation and treatment of cerumen impaction.  Patient reports that she would like to get a hearing test done this afternoon and would like to get her ears cleaned prior to this test.  She denies any ear pain, ear drainage, sudden changes in hearing, history of ear surgeries or ear infections,or other ear complaint.  PMH/Meds/All/SocHx/FamHx/ROS:   History reviewed. No pertinent past medical history.  History reviewed. No pertinent surgical history.  No family history of bleeding disorders, wound healing problems or difficulty with anesthesia.   Social History   Socioeconomic History  . Marital status: Unknown    Spouse name: Not on file  . Number of children: Not on file  . Years of education: Not on file  . Highest education level: Not on file  Occupational History  . Not on file  Tobacco Use  . Smoking status: Never  . Smokeless tobacco: Never  Substance and Sexual Activity  . Alcohol use: Not Currently  . Drug use: Not on file  . Sexual activity: Not on file  Other Topics Concern  . Not on file  Social History Narrative  . Not on file   Social Determinants of Health   Food Insecurity: Not on file  Transportation Needs: Not on file  Safety: Not on file  Living Situation: Not on file    No current outpatient medications on file.  A complete ROS was performed with pertinent positives/negatives noted in the HPI. The remainder of the ROS are negative.    Physical Exam:    There were no vitals taken for this visit.   General: Well developed, well nourished. No acute distress. Voice normal.  Head/Face: Normocephalic, atraumatic. No scars or lesions. No sinus tenderness. Facial nerve intact and equal bilaterally. No facial lacerations. TMJ nontender to palpation.  Eyes: Pupils are equal, round and reactive to light.  Conjunctiva and lids are normal. Normal extraocular mobility.   Ears:   Right: Pinna and external meatus normal, normal ear canal skin and caliber drainage. Impacted cerumen cleared under otomicroscopy once cleared, tympanic membrane intact without effusion or infection.    Left: Pinna and external meatus normal, normal ear canal skin and caliber drainage. Impacted cerumen cleared under otomicroscopy once cleared, tympanic membrane intact without effusion or infection.   Hearing: Normal speech reception to conversational tone.  Nose: No gross deformity or lesions. No purulent discharge. Septum midline, no turbinate hypertrophy with normal mucosa.  Mouth/Oropharynx: Lips normal, teeth and gums normal with good dentition, normal oral vestibule. Normal floor of mouth, tongue and oral mucosa, no mucosal lesions, ulcer or mass, normal tongue mobility. Uvula midline. Hard and soft palate normal with normal mobility. +1 tonsils, no erythema or exudate.  Larynx: See TFL if applicable  Nasopharynx: See TFL if applicable  Neck: Trachea midline. No masses. No crepitus. Normal thyroid  glad palpation without thyromegaly or nodules palpated. Salivary glands normal to palpation without swelling, erythema or mass.   Lymphatic: No lymphadenopathy in the neck.  Respiratory: No stridor or distress.  Extremities: No edema or cyanosis. Warm and well-perfused.  Neurologic: CN II-XII intact. Alert and oriented to self, place and time.  Normal reflexes and motor skills, balance and coordination. Moving all extremities without gross abnormality.  Psychiatric:  No unusual anxiety or evidence of depression. Appropriate affect.    Independent Review of Additional Tests  or Records:   Medical Records  Procedures:   Ear Cerumen Removal  Date/Time: 10/03/2023 9:00 AM  Performed by: Olam Vina Simpler, NP Authorized by: Olam Vina Simpler, NP   Procedure Details:  Impacted cerumen: Yes Location details: bilateral  Post  Procedure Details:  Procedure findings: complete impaction removal Patient tolerance of procedure: patient tolerated the procedure without difficulty Comments: The patient was positioned and the ear was examined with the microscope. Obstructing cerumen was gently removed using suction from both ear canals. TMs fully visualized, normal and intact. Middle ears aerated. No sign of infection or cholesteatoma.     Impression & Plans:  Rachel Douglas is a 77 y.o. female with   1. Bilateral impacted cerumen        ASSESSMENT AND PLAN  1. Ear wax accumulation. The presence of bilateral ear wax impaction was noted in both ears. Ear wax removal was performed per procedure note above.  Patient will get her hearing tested at outside provider today.  Follow-up as needed with ENT for cerumen management.   I have personally spent 30 minutes involved in face-to-face for this visit, 20 minutes for the procedure (billed under procedure code), and 5 minutes reviewing documentation.  Electronically signed by: Olam Vina Simpler, NP 10/03/2023 9:07 AM

## 2023-12-04 DIAGNOSIS — F17211 Nicotine dependence, cigarettes, in remission: Secondary | ICD-10-CM | POA: Diagnosis not present

## 2023-12-04 DIAGNOSIS — F1021 Alcohol dependence, in remission: Secondary | ICD-10-CM | POA: Diagnosis not present

## 2023-12-04 DIAGNOSIS — Z8673 Personal history of transient ischemic attack (TIA), and cerebral infarction without residual deficits: Secondary | ICD-10-CM | POA: Diagnosis not present

## 2023-12-04 DIAGNOSIS — Z008 Encounter for other general examination: Secondary | ICD-10-CM | POA: Diagnosis not present

## 2024-02-13 DIAGNOSIS — S60476A Other superficial bite of right little finger, initial encounter: Secondary | ICD-10-CM | POA: Diagnosis not present

## 2024-02-13 DIAGNOSIS — Z6824 Body mass index (BMI) 24.0-24.9, adult: Secondary | ICD-10-CM | POA: Diagnosis not present

## 2024-02-13 DIAGNOSIS — T63481A Toxic effect of venom of other arthropod, accidental (unintentional), initial encounter: Secondary | ICD-10-CM | POA: Diagnosis not present

## 2024-03-11 DIAGNOSIS — E7800 Pure hypercholesterolemia, unspecified: Secondary | ICD-10-CM | POA: Diagnosis not present

## 2024-03-11 DIAGNOSIS — F4321 Adjustment disorder with depressed mood: Secondary | ICD-10-CM | POA: Diagnosis not present

## 2024-03-17 DIAGNOSIS — H43811 Vitreous degeneration, right eye: Secondary | ICD-10-CM | POA: Diagnosis not present

## 2024-03-17 DIAGNOSIS — H33192 Other retinoschisis and retinal cysts, left eye: Secondary | ICD-10-CM | POA: Diagnosis not present

## 2024-04-11 DIAGNOSIS — E7800 Pure hypercholesterolemia, unspecified: Secondary | ICD-10-CM | POA: Diagnosis not present

## 2024-04-11 DIAGNOSIS — F4321 Adjustment disorder with depressed mood: Secondary | ICD-10-CM | POA: Diagnosis not present

## 2024-04-13 DIAGNOSIS — Z0000 Encounter for general adult medical examination without abnormal findings: Secondary | ICD-10-CM | POA: Diagnosis not present

## 2024-04-13 DIAGNOSIS — R899 Unspecified abnormal finding in specimens from other organs, systems and tissues: Secondary | ICD-10-CM | POA: Diagnosis not present

## 2024-04-13 DIAGNOSIS — Z23 Encounter for immunization: Secondary | ICD-10-CM | POA: Diagnosis not present

## 2024-04-13 DIAGNOSIS — E7800 Pure hypercholesterolemia, unspecified: Secondary | ICD-10-CM | POA: Diagnosis not present

## 2024-04-13 DIAGNOSIS — R944 Abnormal results of kidney function studies: Secondary | ICD-10-CM | POA: Diagnosis not present

## 2024-04-13 DIAGNOSIS — F1021 Alcohol dependence, in remission: Secondary | ICD-10-CM | POA: Diagnosis not present

## 2024-04-15 ENCOUNTER — Other Ambulatory Visit (HOSPITAL_BASED_OUTPATIENT_CLINIC_OR_DEPARTMENT_OTHER): Payer: Self-pay | Admitting: Family Medicine

## 2024-04-15 DIAGNOSIS — Z0000 Encounter for general adult medical examination without abnormal findings: Secondary | ICD-10-CM

## 2024-05-06 ENCOUNTER — Ambulatory Visit (HOSPITAL_BASED_OUTPATIENT_CLINIC_OR_DEPARTMENT_OTHER)
Admission: RE | Admit: 2024-05-06 | Discharge: 2024-05-06 | Disposition: A | Discharge status: Home / Self Care | Payer: Self-pay | Source: Ambulatory Visit | Attending: Family Medicine | Admitting: Family Medicine

## 2024-05-06 DIAGNOSIS — Z0000 Encounter for general adult medical examination without abnormal findings: Secondary | ICD-10-CM | POA: Insufficient documentation

## 2024-05-18 NOTE — Progress Notes (Unsigned)
 Cardiology Office Note:    Date:  05/19/2024   ID:  Rachel Douglas, DOB 13-Aug-1946, MRN [id number]  PCP:  Okey Carlin Redbird, MD   [facility] Providers Cardiologist:  Annabella Scarce, MD Cardiology APP:  Madie Jon Garre, [place]     Referring MD: Okey Carlin Redbird, MD   Chief Complaint  Patient presents with   New Patient (Initial Visit)    Elevated coronary calcium     History of Present Illness:    Rachel Douglas is a 78 y.o. female with a hx of HLD and CVA 08/2016. MRI with infarct in the right caudate body/mid corona radiata. She is a former smoker and has a history of alcoholism.     Cardiac calcium  score 05/06/24 showed coronary calcium  in the LAD and RCA with coronary calcium  score of 619 placing her at the 87th percentile. She was referred to cardiology for further evaluation.   She presents today to establish cardiology care. She got the coronary calcium  score because her daughter had one done and suggested she have one as well.   She has no cardiac complaints. She is active doing yoga twice weekly, attends AA meetings. She walks 2 miles.   She is originally from [place]. She moved to [place]  then married someone in the service and moved around the [place]  before settling in [place]. She has three children (daughter here, 2 children in [place]) and 5 grandchildren, 2 great grandchildren.   She is retired from [employer] - she worked in the business office in billing.   She has an 18 pack year history. She has been sober 44 years. No caffeine, no other illicit drugs.    Past Medical History:  Diagnosis Date   Hyperlipidemia    Stroke [facility])     Past Surgical History:  Procedure Laterality Date   ECTOPIC PREGNANCY SURGERY      Current Medications: Current Meds  Medication Sig   aspirin  EC 81 MG tablet Take 1 tablet (81 mg total) by mouth daily. Swallow whole.   cholecalciferol (VITAMIN D) 1000 units tablet Take 1,000 Units by mouth daily.   CINNAMON PO Take 1  capsule by mouth daily.   Coenzyme Q10 (COQ10) 200 MG CAPS Take 200 mg by mouth daily.   CRANBERRY PO Take 1 capsule by mouth daily.   metoprolol  tartrate (LOPRESSOR ) 50 MG tablet Take 2 hours before procedure.   nitroGLYCERIN  (NITROSTAT ) 0.4 MG SL tablet Place 1 tablet (0.4 mg total) under the tongue every 5 (five) minutes as needed for chest pain.   rosuvastatin (CRESTOR) 20 MG tablet Take 20 mg by mouth daily.   [DISCONTINUED] aspirin  325 MG tablet Take 1 tablet (325 mg total) by mouth daily.     Allergies:   Codeine   Social History   Socioeconomic History   Marital status: Married    Spouse name: Jerrell   Number of children: 3   Years of education: 12   Highest education level: Not on file  Occupational History    Comment: [employer]  Tobacco Use   Smoking status: Former   Smokeless tobacco: Never  Substance and Sexual Activity   Alcohol use: No   Drug use: No   Sexual activity: Not on file  Other Topics Concern   Not on file  Social History Narrative   Lives with husband   Social Drivers of Corporate investment banker Strain: Not on file  Food Insecurity: Not on file  Transportation Needs:  Not on file  Physical Activity: Not on file  Stress: Not on file  Social Connections: Unknown (03/27/2022)   Received from [facility]   Social Network    Social Network: Not on file     Family History: The patient's family history includes Cancer in her father; Diabetes in her father; Hyperlipidemia in her father; Stroke in her father.  ROS:   Please see the history of present illness.     All other systems reviewed and are negative.  EKGs/Labs/Other Studies Reviewed:    The following studies were reviewed today:  EKG Interpretation Date/Time:  Tuesday May 19 2024 14:56:30 EDT Ventricular Rate:  71 PR Interval:  130 QRS Duration:  82 QT Interval:  374 QTC Calculation: 406 R Axis:   25  Text Interpretation: Normal sinus rhythm Normal ECG When  compared with ECG of 07-Sep-2016 18:37, PREVIOUS ECG IS PRESENT Confirmed by Madie Slough ([id number]) on 05/19/2024 3:36:11 PM    Recent Labs: No results found for requested labs within last 365 days.  Recent Lipid Panel    Component Value Date/Time   CHOL 196 09/08/2016 0332   TRIG 182 (H) 09/08/2016 0332   HDL 56 09/08/2016 0332   CHOLHDL 3.5 09/08/2016 0332   VLDL 36 09/08/2016 0332   LDLCALC 104 (H) 09/08/2016 0332     Risk Assessment/Calculations:                Physical Exam:    VS:  BP 116/84   Pulse 71   Ht 5' (1.524 m)   Wt 123 lb (55.8 kg)   SpO2 97%   BMI 24.02 kg/m     Wt Readings from Last 3 Encounters:  05/19/24 123 lb (55.8 kg)  06/04/17 124 lb (56.2 kg)  12/04/16 134 lb 6.4 oz (61 kg)     GEN:  Well nourished, well developed in no acute distress HEENT: Normal NECK: No JVD; No carotid bruits LYMPHATICS: No lymphadenopathy CARDIAC: RRR, no murmurs, rubs, gallops RESPIRATORY:  Clear to auscultation without rales, wheezing or rhonchi  ABDOMEN: Soft, non-tender, non-distended MUSCULOSKELETAL:  No edema; No deformity  SKIN: Warm and dry NEUROLOGIC:  Alert and oriented x 3 PSYCHIATRIC:  Normal affect   ASSESSMENT:    1. Elevated coronary artery calcium  score   2. Hyperlipidemia with target LDL less than 70   3. Cerebrovascular accident (CVA) due to thrombosis of right middle cerebral artery (HCC)   4. Aortic atherosclerosis (HCC)   5. Former smoker    PLAN:    In order of problems listed above:  Coronary artery calcification - risk factors include prior CVA, hx of smoking, HLD - no chest pain, but risk factors - will obtain CT coronary - will give PRN NTG - drop ASA   Hyperlipidemia with LDL goal < 55 - LDL 114 --> now on 20 mg crestor (started 05/13/24) - possible foot pain - we discussed statin holiday vs switching to lipitor - she prefers to just watch for now, foot pain worsened 2 nights ago after walking a mile - consider lower LDL goal  given risk factors    Hx of CVA - reduce ASA to 81 mg, has not seen neurology in several years   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 Raford. Will assign as her primary cardiologist.       Medication Adjustments/Labs and Tests Ordered: Current medicines are  reviewed at length with the patient today.  Concerns regarding medicines are outlined above.  Orders Placed This Encounter  Procedures   CT CORONARY MORPH W/CTA COR W/SCORE W/CA W/CM &/OR WO/CM   EKG 12-Lead   Meds ordered this encounter  Medications   aspirin  EC 81 MG tablet    Sig: Take 1 tablet (81 mg total) by mouth daily. Swallow whole.   nitroGLYCERIN  (NITROSTAT ) 0.4 MG SL tablet    Sig: Place 1 tablet (0.4 mg total) under the tongue every 5 (five) minutes as needed for chest pain.    Dispense:  90 tablet    Refill:  3   metoprolol  tartrate (LOPRESSOR ) 50 MG tablet    Sig: Take 2 hours before procedure.    Dispense:  1 tablet    Refill:  0    Patient Instructions  Medication Instructions:  Decrease the Aspirin  325mg  to 81mg  daily.  Take Nitroglycerin  as needed for chest pain.  On the day of the Coronary CT, take the Metoprolol  Lopressor  tablet 2 hours before procedure.  *If you need a refill on your cardiac medications before your next appointment, please call your pharmacy*   Lab Work: If you have labs (blood work) drawn today and your tests are completely normal, you will receive your results only by: MyChart Message (if you have MyChart) OR A paper copy in the mail If you have any lab test that is abnormal or we need to change your treatment, we will call you to review the results.   Testing/Procedures:   Your cardiac CT will be scheduled at one of the below locations:   [facility] [address] [place], [place] [address] [phone]  OR  [facility] [address] [place], [place] [address] [phone]  OR   [facility] [address] [place], [place] [address] [phone]  OR   [facility] [address] [place], [place] [address] [phone]  OR   [facility] [address]  [place], [place] [address]  If scheduled at [facility], please arrive at the [facility] Entrance (Entrance C2) of [facility] 30 minutes prior to test start time. You can use the FREE valet parking offered at entrance C (encouraged to control the heart rate for the test)  Proceed to the [facility] Radiology Department (first floor) to check-in and test prep.   All radiology patients and guests should use entrance C2 at [facility], accessed from [facility], even though the hospital's physical address listed is [address].    If scheduled at the Heart and Vascular Tower at [address], please enter the parking lot using the [address] entrance and use the FREE valet service at the patient drop-off area. Enter the buidling and check-in with registration on the main floor.  If scheduled at [facility] or [facility], please arrive 15 mins early for check-in and test prep.  There is spacious parking and easy access to the radiology department from the [facility] Heart and Vascular entrance. Please enter here and check-in with the desk attendant.   If scheduled at [facility], please arrive 30 minutes early for check-in and test prep.  Please follow these instructions carefully (unless otherwise directed):  An IV will be required for this test and Nitroglycerin  will be given.  Hold all erectile  dysfunction medications at least 3 days (72 hrs) prior to test. (Ie viagra, cialis, sildenafil, tadalafil, etc)   On the Night Before the  Test: Be sure to Drink plenty of water. Do not consume any caffeinated/decaffeinated beverages or chocolate 12 hours prior to your test. Do not take any antihistamines 12 hours prior to your test. If the patient has contrast allergy: Patient will need a prescription for Prednisone and very clear instructions (as follows): Prednisone 50 mg - take 13 hours prior to test Take another Prednisone 50 mg 7 hours prior to test Take another Prednisone 50 mg 1 hour prior to test Take Benadryl 50 mg 1 hour prior to test Patient must complete all four doses of above prophylactic medications. Patient will need a ride after test due to Benadryl.  On the Day of the Test: Drink plenty of water until 1 hour prior to the test. Do not eat any food 1 hour prior to test. You may take your regular medications prior to the test.  Take metoprolol  (Lopressor ) two hours prior to test. If you take Furosemide/Hydrochlorothiazide/Spironolactone/Chlorthalidone, please HOLD on the morning of the test. Patients who wear a continuous glucose monitor MUST remove the device prior to scanning. FEMALES- please wear underwire-free bra if available, avoid dresses & tight clothing  After the Test: Drink plenty of water. After receiving IV contrast, you may experience a mild flushed feeling. This is normal. On occasion, you may experience a mild rash up to 24 hours after the test. This is not dangerous. If this occurs, you can take Benadryl 25 mg, Zyrtec, Claritin, or Allegra and increase your fluid intake. (Patients taking Tikosyn should avoid Benadryl, and may take Zyrtec, Claritin, or Allegra) If you experience trouble breathing, this can be serious. If it is severe call 911 IMMEDIATELY. If it is mild, please call our office.  We will call to schedule your test 2-4 weeks out understanding that some insurance companies will need an authorization prior to the service being performed.   For more information and frequently  asked questions, please visit our website : [email or web address]  For non-scheduling related questions, please contact the cardiac imaging nurse navigator should you have any questions/concerns: Cardiac Imaging Nurse Navigators Direct Office Dial: [phone]   For scheduling needs, including cancellations and rescheduling, please call [place], [phone].    Follow-Up: At [facility], you and your health needs are our priority.  As part of our continuing mission to provide you with exceptional heart care, we have created designated Provider Care Teams.  These Care Teams include your primary Cardiologist (physician) and Advanced Practice Providers (APPs -  Physician Assistants and Nurse Practitioners) who all work together to provide you with the care you need, when you need it.  We recommend signing up for the patient portal called MyChart.  Sign up information is provided on this After Visit Summary.  MyChart is used to connect with patients for Virtual Visits (Telemedicine).  Patients are able to view lab/test results, encounter notes, upcoming appointments, etc.  Non-urgent messages can be sent to your provider as well.   To learn more about what you can do with MyChart, go to [email or web address].    Your next appointment:   1 month(s)   Provider:   Annabella Scarce, MD    Other Instructions Thank you for choosing [facility]!       Signed, Jon Nat Hails, PA  05/19/2024 4:19 PM    [facility]

## 2024-05-19 ENCOUNTER — Ambulatory Visit: Attending: Cardiology | Admitting: Physician Assistant

## 2024-05-19 ENCOUNTER — Encounter: Payer: Self-pay | Admitting: Physician Assistant

## 2024-05-19 ENCOUNTER — Ambulatory Visit: Admitting: Physician Assistant

## 2024-05-19 VITALS — BP 116/84 | HR 71 | Ht 60.0 in | Wt 123.0 lb

## 2024-05-19 DIAGNOSIS — I700 Atherosclerosis of aorta: Secondary | ICD-10-CM | POA: Diagnosis not present

## 2024-05-19 DIAGNOSIS — R931 Abnormal findings on diagnostic imaging of heart and coronary circulation: Secondary | ICD-10-CM

## 2024-05-19 DIAGNOSIS — Z87891 Personal history of nicotine dependence: Secondary | ICD-10-CM

## 2024-05-19 DIAGNOSIS — I63311 Cerebral infarction due to thrombosis of right middle cerebral artery: Secondary | ICD-10-CM | POA: Diagnosis not present

## 2024-05-19 DIAGNOSIS — E785 Hyperlipidemia, unspecified: Secondary | ICD-10-CM

## 2024-05-19 MED ORDER — ASPIRIN 81 MG PO TBEC: 81.0000 mg | DELAYED_RELEASE_TABLET | Freq: Every day | ORAL | Status: AC

## 2024-05-19 MED ORDER — METOPROLOL TARTRATE 50 MG PO TABS: ORAL_TABLET | ORAL | 0 refills | Status: DC

## 2024-05-19 MED ORDER — NITROGLYCERIN 0.4 MG SL SUBL: 0.4000 mg | SUBLINGUAL_TABLET | SUBLINGUAL | 3 refills | Status: AC | PRN

## 2024-05-19 NOTE — Patient Instructions (Addendum)
 Medication Instructions:  Decrease the Aspirin  325mg  to 81mg  daily.  Take Nitroglycerin  as needed for chest pain.  On the day of the Coronary CT, take the Metoprolol  Lopressor  tablet 2 hours before procedure.  *If you need a refill on your cardiac medications before your next appointment, please call your pharmacy*   Lab Work: If you have labs (blood work) drawn today and your tests are completely normal, you will receive your results only by: MyChart Message (if you have MyChart) OR A paper copy in the mail If you have any lab test that is abnormal or we need to change your treatment, we will call you to review the results.   Testing/Procedures:   Your cardiac CT will be scheduled at one of the below locations:   [facility] [address] [place], [place] [address] [phone]  OR  [facility] [address] [place], [place] [address] [phone]  OR   [facility] [address] [place], [place] [address] [phone]  OR   [facility] [address] [place], [place] [address] [phone]  OR   [facility] [address]  [place], [place] [address]  If scheduled at [facility], please arrive at the [facility] and Children's Entrance (Entrance C2) of [facility] 30 minutes prior to test start time. You can use the FREE valet parking offered at entrance C (encouraged to control the heart rate for the test)  Proceed to the [facility] Radiology Department (first floor) to check-in and test prep.   All radiology patients and guests should use entrance C2 at [facility], accessed from [facility], even though the hospital's physical address listed is [address].    If scheduled at the Heart and Vascular Tower at [address], please enter the parking lot using the [address]  entrance and use the FREE valet service at the patient drop-off area. Enter the buidling and check-in with registration on the main floor.  If scheduled at [facility] or [facility], please arrive 15 mins early for check-in and test prep.  There is spacious parking and easy access to the radiology department from the [facility] entrance. Please enter here and check-in with the desk attendant.   If scheduled at [facility], please arrive 30 minutes early for check-in and test prep.  Please follow these instructions carefully (unless otherwise directed):  An IV will be required for this test and Nitroglycerin  will be given.  Hold all erectile dysfunction medications at least 3 days (72 hrs) prior to test. (Ie viagra, cialis, sildenafil, tadalafil, etc)   On the Night Before the Test: Be sure to Drink plenty of water. Do not consume any caffeinated/decaffeinated beverages or chocolate 12 hours prior to your test. Do not take any antihistamines 12 hours prior to your test. If the patient has contrast allergy: Patient will need a prescription for Prednisone and very clear instructions (as follows): Prednisone 50 mg - take 13 hours prior to test Take another Prednisone 50 mg 7 hours prior to test Take another Prednisone 50 mg 1 hour prior to test Take Benadryl 50 mg 1 hour prior to test Patient must complete all four doses of above prophylactic medications. Patient will need a ride after test due to Benadryl.  On the Day of the Test: Drink plenty of water  until 1 hour prior to the test. Do not eat any food 1 hour prior to test. You may take your regular medications prior to the test.  Take metoprolol  (Lopressor ) two hours prior to test. If you take Furosemide/Hydrochlorothiazide/Spironolactone/Chlorthalidone, please HOLD on the morning of the test. Patients who wear a continuous glucose monitor MUST remove the device  prior to scanning. FEMALES- please wear underwire-free bra if available, avoid dresses & tight clothing  After the Test: Drink plenty of water. After receiving IV contrast, you may experience a mild flushed feeling. This is normal. On occasion, you may experience a mild rash up to 24 hours after the test. This is not dangerous. If this occurs, you can take Benadryl 25 mg, Zyrtec, Claritin, or Allegra and increase your fluid intake. (Patients taking Tikosyn should avoid Benadryl, and may take Zyrtec, Claritin, or Allegra) If you experience trouble breathing, this can be serious. If it is severe call 911 IMMEDIATELY. If it is mild, please call our office.  We will call to schedule your test 2-4 weeks out understanding that some insurance companies will need an authorization prior to the service being performed.   For more information and frequently asked questions, please visit our website : [email or web address]  For non-scheduling related questions, please contact the cardiac imaging nurse navigator should you have any questions/concerns: Cardiac Imaging Nurse Navigators Direct Office Dial: [phone]   For scheduling needs, including cancellations and rescheduling, please call [place], [phone].    Follow-Up: At [facility], you and your health needs are our priority.  As part of our continuing mission to provide you with exceptional heart care, we have created designated Provider Care Teams.  These Care Teams include your primary Cardiologist (physician) and Advanced Practice Providers (APPs -  Physician Assistants and Nurse Practitioners) who all work together to provide you with the care you need, when you need it.  We recommend signing up for the patient portal called MyChart.  Sign up information is provided on this After Visit Summary.  MyChart is used to connect with patients for Virtual Visits (Telemedicine).  Patients are able to view lab/test results,  encounter notes, upcoming appointments, etc.  Non-urgent messages can be sent to your provider as well.   To learn more about what you can do with MyChart, go to [email or web address].    Your next appointment:   1 month(s)   Provider:   Annabella Scarce, MD    Other Instructions Thank you for choosing [facility]!

## 2024-05-21 ENCOUNTER — Encounter (HOSPITAL_COMMUNITY): Payer: Self-pay

## 2024-05-22 ENCOUNTER — Telehealth (HOSPITAL_COMMUNITY): Payer: Self-pay | Admitting: *Deleted

## 2024-05-22 NOTE — Telephone Encounter (Signed)
 Reaching out to patient to offer assistance regarding upcoming cardiac imaging study; pt verbalizes understanding of appt date/time, parking situation and where to check in, pre-test NPO status and medications ordered, and verified current allergies; name and call back number provided for further questions should they arise Johney Frame RN Navigator Cardiac Imaging [facility] [phone] office [phone] cell

## 2024-05-25 ENCOUNTER — Ambulatory Visit (HOSPITAL_COMMUNITY)
Admission: RE | Admit: 2024-05-25 | Discharge: 2024-05-25 | Disposition: A | Discharge status: Home / Self Care | Source: Ambulatory Visit | Attending: Cardiology | Admitting: Cardiology

## 2024-05-25 ENCOUNTER — Encounter (HOSPITAL_COMMUNITY): Payer: Self-pay

## 2024-05-25 DIAGNOSIS — I2510 Atherosclerotic heart disease of native coronary artery without angina pectoris: Secondary | ICD-10-CM | POA: Diagnosis not present

## 2024-05-25 DIAGNOSIS — R931 Abnormal findings on diagnostic imaging of heart and coronary circulation: Secondary | ICD-10-CM | POA: Diagnosis not present

## 2024-05-25 DIAGNOSIS — R079 Chest pain, unspecified: Secondary | ICD-10-CM | POA: Diagnosis present

## 2024-05-25 MED ORDER — NITROGLYCERIN 0.4 MG SL SUBL
0.8000 mg | SUBLINGUAL_TABLET | Freq: Once | SUBLINGUAL | Status: AC
  Administered 2024-05-25: 0.8 mg via SUBLINGUAL

## 2024-05-25 MED ORDER — IOHEXOL 350 MG/ML SOLN
100.0000 mL | Freq: Once | INTRAVENOUS | Status: AC | PRN
  Administered 2024-05-25: 100 mL via INTRAVENOUS

## 2024-05-26 ENCOUNTER — Ambulatory Visit (HOSPITAL_COMMUNITY)
Admission: RE | Admit: 2024-05-26 | Discharge: 2024-05-26 | Disposition: A | Discharge status: Home / Self Care | Source: Ambulatory Visit | Attending: Cardiology | Admitting: Cardiology

## 2024-05-26 ENCOUNTER — Other Ambulatory Visit: Payer: Self-pay | Admitting: Cardiology

## 2024-05-26 DIAGNOSIS — I2510 Atherosclerotic heart disease of native coronary artery without angina pectoris: Secondary | ICD-10-CM | POA: Diagnosis not present

## 2024-05-26 DIAGNOSIS — R931 Abnormal findings on diagnostic imaging of heart and coronary circulation: Secondary | ICD-10-CM | POA: Diagnosis not present

## 2024-05-28 ENCOUNTER — Ambulatory Visit: Payer: Self-pay | Admitting: Cardiology

## 2024-05-28 ENCOUNTER — Other Ambulatory Visit: Payer: Self-pay | Admitting: Family Medicine

## 2024-05-28 DIAGNOSIS — Z1231 Encounter for screening mammogram for malignant neoplasm of breast: Secondary | ICD-10-CM

## 2024-06-11 DIAGNOSIS — E7800 Pure hypercholesterolemia, unspecified: Secondary | ICD-10-CM | POA: Diagnosis not present

## 2024-06-11 DIAGNOSIS — F4321 Adjustment disorder with depressed mood: Secondary | ICD-10-CM | POA: Diagnosis not present

## 2024-06-17 ENCOUNTER — Institutional Professional Consult (permissible substitution) (HOSPITAL_BASED_OUTPATIENT_CLINIC_OR_DEPARTMENT_OTHER): Admitting: Nurse Practitioner

## 2024-06-26 ENCOUNTER — Ambulatory Visit (INDEPENDENT_AMBULATORY_CARE_PROVIDER_SITE_OTHER): Admitting: Cardiovascular Disease

## 2024-06-26 ENCOUNTER — Encounter (HOSPITAL_BASED_OUTPATIENT_CLINIC_OR_DEPARTMENT_OTHER): Payer: Self-pay | Admitting: Cardiovascular Disease

## 2024-06-26 VITALS — BP 102/52 | HR 63 | Ht 60.0 in | Wt 122.8 lb

## 2024-06-26 DIAGNOSIS — I2510 Atherosclerotic heart disease of native coronary artery without angina pectoris: Secondary | ICD-10-CM | POA: Insufficient documentation

## 2024-06-26 DIAGNOSIS — Z79899 Other long term (current) drug therapy: Secondary | ICD-10-CM

## 2024-06-26 DIAGNOSIS — E785 Hyperlipidemia, unspecified: Secondary | ICD-10-CM

## 2024-06-26 MED ORDER — ATORVASTATIN CALCIUM 20 MG PO TABS: 20.0000 mg | ORAL_TABLET | Freq: Every day | ORAL | 3 refills | Status: AC

## 2024-06-26 NOTE — Progress Notes (Signed)
 Cardiology Office Note:  .   Date:  06/26/2024  ID:  Rachel Douglas, DOB 21-Sep-1946, MRN [id number] PCP: Okey Carlin Redbird, MD  [facility] Providers Cardiologist:  Annabella Scarce, MD Cardiology APP:  Madie Jon Garre, [place]    History of Present Illness: .    Rachel Douglas is a 78 y.o. female with moderate non-obstructive CAD, prior CVA and hyperlipidemia here for follow up.  She had a stroke in 2017.  MRI revealed an infarct of the right caudate body/mid corona radiata.  She had a prior history of tobacco abuse and alcoholism.  She has not had any alcohol in over 44 years.  Coronary calcium  score 04/2024 revealed a calcium  score of 619 which was 87th percentile.  She saw Mercy Madie, [place] 05/2024 and had a coronary CT-a which revealed moderate stenosis of the RCA and LAD.  FFR CT was consistent with nonobstructive disease.  Discussed the use of AI scribe software for clinical note transcription with the patient, who gave verbal consent to proceed.  History of Present Illness Ms. Demery experiences neuropathy that worsens with the use of rosuvastatin, which she started in July 2025. The neuropathy is more pronounced by the end of the day, although it is present upon waking. It primarily affects her right leg, with a sensation of coldness. She has not been on any medication specifically for neuropathy and was informed by a previous doctor that she has neuropathy based on physical examination findings.  She has a history of stroke in 2017, initially presenting with unsteady gait and difficulty writing. She continues to experience issues with her handwriting, noting it is not as it was before the stroke. No recent falls, lightheadedness, dizziness, or leg pain while walking.  She manages her cholesterol with diet and exercise since her stroke, but her cholesterol levels have fluctuated. She was not on any cholesterol medication until July 2025 when she started rosuvastatin. Her LDL  cholesterol was noted to be 114 mg/dL.  She engages in regular physical activity, including walking for half an hour daily and water yoga twice a week. She reports a decrease in her walking speed, attributing it to an unknown cause, as she does not feel off balance or experience shortness of breath or chest pain. She has not participated in physical therapy.  Her diet has been adjusted to be more careful, especially after her son-in-law's passing, and she is mindful of her carbohydrate intake. She is uncertain about reading food labels and understanding nutritional content, particularly regarding fats and carbohydrates.  ROS:  As per HPI  Studies Reviewed: SABRA       Coronary CT-A 05/25/24: IMPRESSION: 1. Coronary calcium  score of 799. This was 90 percentile for age and sex matched control.   2. Normal coronary origin with right dominance.   3.CAD-RADS 3. Moderate stenosis. Consider symptom-guided anti-ischemic pharmacotherapy as well as risk factor modification per guideline directed care. Additional analysis with CT FFR will be submitted.   4. Total plaque volume 486 mm3 which is 62nd percentile for age- and sex-matched controls (calcified plaque 137 mm3; non-calcified plaque 349 mm3, Low attenuation 2 mm3).   IMPRESSION: 1.  CT FFR analysis showed no significant stenosis.  Risk Assessment/Calculations:             Physical Exam:   VS:  BP (!) 102/52   Pulse 63   Ht 5' (1.524 m)   Wt 122 lb 12.8 oz (55.7 kg)   SpO2 98%  BMI 23.98 kg/m  , BMI Body mass index is 23.98 kg/m. GENERAL:  Well appearing HEENT: Pupils equal round and reactive, fundi not visualized, oral mucosa unremarkable NECK:  No jugular venous distention, waveform within normal limits, carotid upstroke brisk and symmetric, no bruits, no thyromegaly LUNGS:  Clear to auscultation bilaterally HEART:  RRR.  PMI not displaced or sustained,S1 and S2 within normal limits, no S3, no S4, no clicks, no rubs, no  murmurs ABD:  Flat, positive bowel sounds normal in frequency in pitch, no bruits, no rebound, no guarding, no midline pulsatile mass, no hepatomegaly, no splenomegaly EXT:  1+DP/PT bilaterally, no edema, no cyanosis no clubbing SKIN:  No rashes no nodules NEURO:  Cranial nerves II through XII grossly intact, motor grossly intact throughout PSYCH:  Cognitively intact, oriented to person place and time   ASSESSMENT AND PLAN: .    Assessment & Plan # Coronary artery disease with moderate coronary artery stenosis Moderate stenosis in right coronary artery and left anterior descending artery with 50-69% blockage. High risk for cardiovascular events due to ischemic stroke and coronary artery disease. Emphasized diet, exercise, and medication to manage condition and prevent progression. - Encourage aerobic exercise, at least 150 minutes per week. - Recommend participation in Prep program at [facility] for dietary and exercise guidance. -Continue aspirin  81mg  daily  # Hyperlipidemia in the setting of atherosclerotic cardiovascular disease LDL cholesterol at 114 mg/dL, not optimal given ischemic stroke and coronary artery disease. Goal to reduce LDL below 70 mg/dL, ideally below 55 mg/dL. Rosuvastatin may contribute to neuropathy symptoms. Statins crucial for secondary prevention. - Discontinue rosuvastatin for two weeks to assess neuropathy symptoms. - Initiate atorvastatin  starting September 1st. - Recheck cholesterol levels in November or December with fasting lab work.  # Right lower extremity neuropathy, unspecified etiology Neuropathy symptoms primarily in right leg, increased discomfort by end of day. Previous diagnosis without clear etiology. Differential includes possible nerve compression or back issue. - Monitor symptoms after discontinuing rosuvastatin for two weeks.  # History of ischemic stroke Ischemic stroke in 2017 with residual effects on handwriting. Increased risk for future strokes  due to coronary artery disease and hyperlipidemia. Emphasized importance of secondary prevention strategies. - Continue secondary prevention strategies including medication, diet, and exercise. - LDL goal <55 - Continue aspirin     Dispo: f/u 6 months me or APP  Signed, Annabella Scarce, MD

## 2024-06-26 NOTE — Patient Instructions (Addendum)
 Medication Instructions:  Please STOP Crestor.   Then, in 2 weeks, please START atorvastatin  10 mg daily.  *If you need a refill on your cardiac medications before your next appointment, please call your pharmacy*  Lab Work: Please make an appointment at any [facility] to have your FASTING lipids and a CMET drawn in late November/early December 2025.  If you have labs (blood work) drawn today and your tests are completely normal, you will receive your results only by: MyChart Message (if you have MyChart) OR A paper copy in the mail If you have any lab test that is abnormal or we need to change your treatment, we will call you to review the results.  Testing/Procedures: None.  Follow-Up: At [facility], you and your health needs are our priority.  As part of our continuing mission to provide you with exceptional heart care, our providers are all part of one team.  This team includes your primary Cardiologist (physician) and Advanced Practice Providers or APPs (Physician Assistants and Nurse Practitioners) who all work together to provide you with the care you need, when you need it.  Your next appointment:   6 month(s)  Provider:   Annabella Scarce, MD    Dr. Scarce has referred you to the PREP Wellness program at the [facility].

## 2024-06-29 ENCOUNTER — Telehealth: Payer: Self-pay

## 2024-06-29 NOTE — Telephone Encounter (Signed)
 Called to explain PREP, she would like to attend; needs M/W class has just joined [employer] and is taking water yoga; will contact her when next M/W class is scheduled at [facility].

## 2024-07-07 ENCOUNTER — Telehealth: Payer: Self-pay | Admitting: Cardiovascular Disease

## 2024-07-07 NOTE — Telephone Encounter (Signed)
 Pt c/o medication issue:  1. Name of Medication: atorvastatin  (LIPITOR) 20 MG tablet   2. How are you currently taking this medication (dosage and times per day)?  Take 1 tablet (20 mg total) by mouth daily. Please start this medication 07/10/24.      3. Are you having a reaction (difficulty breathing--STAT)? No  4. What is your medication issue? Pt is requesting a callback regarding her stating when she goes to pick up the medication from pharmacy, they've told her it's not there. Please advise

## 2024-07-07 NOTE — Telephone Encounter (Signed)
 Called patient who states she went to pharmacy and was told they did not have the prescription.  Called the pharmacy and they do have it on file.  They will get it ready for her.  Called the patient back and left her a voicemail that they are preparing for her.

## 2024-07-08 ENCOUNTER — Ambulatory Visit
Admission: RE | Admit: 2024-07-08 | Discharge: 2024-07-08 | Disposition: A | Discharge status: Home / Self Care | Source: Ambulatory Visit | Attending: Family Medicine | Admitting: Family Medicine

## 2024-07-08 DIAGNOSIS — Z1231 Encounter for screening mammogram for malignant neoplasm of breast: Secondary | ICD-10-CM | POA: Diagnosis not present

## 2024-07-10 ENCOUNTER — Telehealth (HOSPITAL_BASED_OUTPATIENT_CLINIC_OR_DEPARTMENT_OTHER): Payer: Self-pay | Admitting: Cardiovascular Disease

## 2024-07-10 NOTE — Telephone Encounter (Signed)
 Reviewed scheduled where Geni was covering Dr Raford and 20 mg was written down.  Reached out to Kristin A Pharm D via secure chat, comments below   tell her that atorvastatin  20 mg is less potent that 20 mg rosuvastatin, so even though the mg is the same, atorvastatin  is going to be less likely to cause problems at that dose.   looks like a typo on Erica's part, Dr. Raford wrote 20 and that is reasonable.   Advised patient, verbalized understanding

## 2024-07-10 NOTE — Telephone Encounter (Signed)
 Pt c/o medication issue:  1. Name of Medication:   atorvastatin  (LIPITOR) 20 MG tablet   2. How are you currently taking this medication (dosage and times per day)?   Patient stated she took her first dose today  3. Are you having a reaction (difficulty breathing--STAT)?   No  4. What is your medication issue?   Patient stated she just noticed this medication is prescribed for 20 mg but patient stated she believes she should be on 10 mg.

## 2024-07-12 DIAGNOSIS — E7800 Pure hypercholesterolemia, unspecified: Secondary | ICD-10-CM | POA: Diagnosis not present

## 2024-07-12 DIAGNOSIS — F4321 Adjustment disorder with depressed mood: Secondary | ICD-10-CM | POA: Diagnosis not present

## 2024-07-29 ENCOUNTER — Telehealth: Payer: Self-pay

## 2024-07-29 NOTE — Telephone Encounter (Signed)
 Channel is interested in taking the next class at [facility], we will contact her when that date is set which should be in November.

## 2024-08-11 DIAGNOSIS — F4321 Adjustment disorder with depressed mood: Secondary | ICD-10-CM | POA: Diagnosis not present

## 2024-08-11 DIAGNOSIS — E7800 Pure hypercholesterolemia, unspecified: Secondary | ICD-10-CM | POA: Diagnosis not present

## 2024-08-17 DIAGNOSIS — S300XXA Contusion of lower back and pelvis, initial encounter: Secondary | ICD-10-CM | POA: Diagnosis not present

## 2024-08-17 DIAGNOSIS — Z6824 Body mass index (BMI) 24.0-24.9, adult: Secondary | ICD-10-CM | POA: Diagnosis not present

## 2024-08-31 ENCOUNTER — Telehealth: Payer: Self-pay

## 2024-08-31 NOTE — Telephone Encounter (Signed)
 Spoke about upcoming PREP class at [facility] and she will check calendar and call me back.

## 2024-09-09 NOTE — Progress Notes (Signed)
 [facility] PREP Evaluation  Patient Details  Name: Rachel Douglas MRN: [id number] Date of Birth: [birth year] Age: 78 y.o. PCP: Okey Carlin Redbird, MD  Vitals:   09/09/24 1517  Pulse: 68  SpO2: 99%  Weight: 120 lb 6.4 oz (54.6 kg)  Height: 5' (1.524 m)     [facility] Eval - 09/09/24 1500       [facility] PREP Location   [facility] PREP Location [facility]      Referral    Referring Provider Raford    Reason for referral Heart Failure    Program Start Date 09/21/24      Measurement   Waist Circumference 32.5 inches    Hip Circumference 34.75 inches      Information for Trainer   Goals Read Labels    Current Exercise water yoga    Pertinent Medical History Heart Blockage      Mobility and Daily Activities   I find it easy to walk up or down two or more flights of stairs. 4    I have no trouble taking out the trash. 4    I do housework such as vacuuming and dusting on my own without difficulty. 4    I can easily lift a gallon of milk (8lbs). 4    I can easily walk a mile. 4    I have no trouble reaching into high cupboards or reaching down to pick up something from the floor. 4    I do not have trouble doing out-door work such as loss adjuster, chartered, raking leaves, or gardening. 4      Mobility and Daily Activities   I feel younger than my age. 4    I feel independent. 4    I feel energetic. 4    I live an active life.  4    I feel strong. 4    I feel healthy. 4    I feel active as other people my age. 4      How fit and strong are you.   Fit and Strong Total Score 56         Past Medical History:  Diagnosis Date   CAD in native artery 06/26/2024   Hyperlipidemia    Stroke [facility])    Past Surgical History:  Procedure Laterality Date   ECTOPIC PREGNANCY SURGERY     Social History   Tobacco Use  Smoking Status Former  Smokeless Tobacco Never  Alaisha is ready for prep class starting 09/21/2024  Jerona Irving 09/09/2024, 3:28 PM

## 2024-09-21 NOTE — Progress Notes (Signed)
 [employer] PREP Weekly Session  Patient Details  Name: [name] MRN: [id number] Date of Birth: [birth year] Age: 78 y.o. PCP: Okey Carlin Redbird, MD  There were no vitals filed for this visit.   [facility] Weekly seesion - 09/21/24 1500       [facility] PREP Location   [facility] PREP Location [facility]      Weekly Session   Topic Discussed Goal setting and welcome to the program   Talk about the notebook and what to expect and a tour on the [facility]   Classes attended to date 1          Jerona Irving 09/21/2024, 3:26 PM

## 2024-09-28 NOTE — Progress Notes (Signed)
 [facility] PREP Weekly Session  Patient Details  Name: Rachel Douglas MRN: [id number] Date of Birth: Jun 18, 1946 Age: 78 y.o. PCP: Okey Carlin Redbird, MD  Vitals:   09/28/24 1509  Weight: 119 lb 3.2 oz (54.1 kg)     [facility] Weekly seesion - 09/28/24 1500       [facility] PREP Location   [facility] PREP Location [facility]      Weekly Session   Topic Discussed Importance of resistance training;Other ways to be active   Difference between cardio and strength training, went over white weekly sheet, talked about diet trends   Minutes exercised this week 30 minutes    Classes attended to date 3          Suzen Ash 09/28/2024, 3:10 PM

## 2024-10-05 NOTE — Progress Notes (Signed)
 [facility] PREP Weekly Session  Patient Details  Name: ANGELLY SPEARING MRN: [id number] Date of Birth: Oct 30, 1946 Age: 78 y.o. PCP: Okey Carlin Redbird, MD  Vitals:   10/05/24 1526  Weight: 120 lb (54.4 kg)     [facility] Weekly seesion - 10/05/24 1500       [facility] PREP Location   [facility] PREP Location [facility]      Weekly Session   Topic Discussed Healthy eating tips   Foods to reduce, foods to increase; introduced Yuka app   Minutes exercised this week 50 minutes    Classes attended to date 5          Ahmaud Duthie B Jaeshaun Riva 10/05/2024, 3:27 PM

## 2024-10-10 ENCOUNTER — Other Ambulatory Visit (HOSPITAL_BASED_OUTPATIENT_CLINIC_OR_DEPARTMENT_OTHER): Payer: Self-pay | Admitting: Cardiovascular Disease

## 2024-10-12 ENCOUNTER — Telehealth (HOSPITAL_BASED_OUTPATIENT_CLINIC_OR_DEPARTMENT_OTHER): Payer: Self-pay | Admitting: Cardiovascular Disease

## 2024-10-12 MED ORDER — ATORVASTATIN CALCIUM 20 MG PO TABS: 20.0000 mg | ORAL_TABLET | Freq: Every day | ORAL | 3 refills | Status: AC

## 2024-10-12 NOTE — Addendum Note (Signed)
 Addended by: Eduarda Scrivens on: 10/12/2024 12:57 PM   Modules accepted: Orders

## 2024-10-12 NOTE — Telephone Encounter (Signed)
 Confirmed atorvastatin  prescription is current.  Called [facility] and let them know atorvastatin  is the only active prescription.    She stated that they have filled the atorvastatin  today and the patient has already picked it up.

## 2024-10-12 NOTE — Telephone Encounter (Signed)
 Showing expired in our med list so sent a new script for same med to [facility] so that they have up to date script.

## 2024-10-12 NOTE — Progress Notes (Signed)
 [facility] PREP Weekly Session  Patient Details  Name: Rachel Douglas MRN: [id number] Date of Birth: June 07, 1946 Age: 78 y.o. PCP: Okey Carlin Redbird, MD  Vitals:   10/12/24 1518  Weight: 117 lb 3.2 oz (53.2 kg)     [facility] Weekly seesion - 10/12/24 1500       [facility] PREP Location   [facility] PREP Location [facility]      Weekly Session   Topic Discussed Health habits   talked about sugar, water intake and app to help watch cal intake.   Minutes exercised this week 225 minutes    Classes attended to date 6          Jerona Irving 10/12/2024, 3:22 PM

## 2024-10-12 NOTE — Telephone Encounter (Signed)
  Pt c/o medication issue:  1. Name of Medication:   atorvastatin  (LIPITOR) 20 MG tablet    2. How are you currently taking this medication (dosage and times per day)? Take 1 tablet (20 mg total) by mouth daily. Please start this medication 07/10/24.   3. Are you having a reaction (difficulty breathing--STAT)? No   4. What is your medication issue? Patient said when she went to her pharmacy to refill this medication she was told that her prescription is crestor and was told to call us  to verify her medication. Also, she want to clarify if she needs to take 10 mg of her lipitor or 20 mg? In her AVS it says: Medication Instructions:  Please STOP Crestor.    Then, in 2 weeks, please START atorvastatin  10 mg daily.

## 2024-10-26 NOTE — Progress Notes (Signed)
 [facility] PREP Weekly Session  Patient Details  Name: Rachel Douglas MRN: [id number] Date of Birth: [birth year] Age: 78 y.o. PCP: Okey Carlin Redbird, MD  Vitals:   10/26/24 1522  Weight: 117 lb (53.1 kg)     [facility] Weekly seesion - 10/26/24 1500       [facility] PREP Location   [facility] PREP Location [facility]      Weekly Session   Topic Discussed Restaurant Eating   Limit Na intake 1500-2300 mg/day; [employer] exercised this week 365 minutes    Classes attended to date 24          Rachel Douglas 10/26/2024, 3:24 PM

## 2024-10-28 ENCOUNTER — Telehealth: Payer: Self-pay | Admitting: Cardiovascular Disease

## 2024-10-28 NOTE — Telephone Encounter (Signed)
 Pt rescheduled f/u appt to 02/01/25. Pt wanted to make sure her lab orders will still be active if she comes to get them around 01/11/25. Please advise.

## 2024-10-28 NOTE — Telephone Encounter (Signed)
 Spoke with patient regarding labs Orders should still be current since within 6 months Will mail lab orders to patient

## 2024-11-09 NOTE — Progress Notes (Signed)
 [facility] PREP Weekly Session  Patient Details  Name: Rachel Douglas MRN: [id number] Date of Birth: 05-14-1946 Age: 78 y.o. PCP: Okey Carlin Redbird, MD  Vitals:   11/09/24 1544  Weight: 116 lb 9.6 oz (52.9 kg)     [facility] Weekly seesion - 11/09/24 1500       [facility] PREP Location   [facility] PREP Location [facility]      Weekly Session   Topic Discussed Expectations and non-scale victories   Halfway through program; review, restate, revisit goals; review of health guidelines;staying positive   Minutes exercised this week 150 minutes    Classes attended to date 57          Rachel Douglas 11/09/2024, 3:46 PM

## 2024-11-16 NOTE — Progress Notes (Signed)
 [facility] Weekly Session  Patient Details  Name: Rachel Douglas MRN: [id number] Date of Birth: Apr 24, 1946 Age: 79 y.o. PCP: Okey Carlin Redbird, MD  Vitals:   11/16/24 1526  Weight: 115 lb 8 oz (52.4 kg)     [facility] Weekly seesion - 11/16/24 1500       [facility] PREP Location   [facility] PRE[facility] Location [facility]      Weekly Session   Topic Discussed Other   Portion control; visualize your portion size demo; review of [employer] nutrition/food label   Minutes exercised this week 40 minutes    Classes attended to date 75          Rachel Douglas Rachel Douglas 11/16/2024, 3:27 PM

## 2024-11-23 NOTE — Progress Notes (Signed)
 [facility] Weekly Session  Patient Details  Name: ARTISHA CAPRI MRN: [id number] Date of Birth: 1946/05/29 Age: 79 y.o. PCP: Okey Carlin Redbird, MD  Vitals:   11/23/24 1513  Weight: 116 lb 8 oz (52.8 kg)     [facility] Weekly seesion - 11/23/24 1500       [facility] PREP Location   [facility] PREP Location [facility]      Weekly Session   Topic Discussed Calorie breakdown   Macronutrients: fats, carbs and proteins, USDA guidelines for daily intake; difference between simple and complex carbs; review of trustworthy supplement organizations   Minutes exercised this week 150 minutes    Classes attended to date 40          Adonna KATHEE Mody 11/23/2024, 3:14 PM

## 2024-11-30 NOTE — Progress Notes (Signed)
 [facility] PRE[facility] Weekly Session  Patient Details  Name: GALILEA QUITO MRN: [id number] Date of Birth: 11/17/1945 Age: 79 y.o. PCP: Okey Carlin Redbird, MD  Vitals:   11/30/24 1608  Weight: 114 lb 8 oz (51.9 kg)     [facility] Weekly seesion - 11/30/24 1600       [facility] PREP Location   [facility] PREP Location [facility]      Weekly Session   Topic Discussed Finding support   Accoutability; [facility] membership talk w/Nick   Minutes exercised this week 240 minutes    Classes attended to date 70          Rande Dario B Nakya Weyand 11/30/2024, 4:09 PM

## 2024-12-16 NOTE — Progress Notes (Signed)
 [facility] PRE[facility] Weekly Session  Patient Details  Name: Rachel Douglas MRN: [id number] Date of Birth: [birth year] Age: 79 y.o. PCP: Okey Carlin Redbird, MD  Vitals:   12/16/24 1511  Weight: 116 lb 9.6 oz (52.9 kg)     [facility] Weekly seesion - 12/16/24 1500       [facility] PREP Location   [facility] PREP Location [facility]      Weekly Session   Topic Discussed Hitting roadblocks   How fit and strong survey and fit testing completed; final assessment visit scheduled; review of goals and activity plan and PREP survey to bring to final visit.   Minutes exercised this week 150 minutes    Classes attended to date 51          Illyria Sobocinski B Lenoard Helbert 12/16/2024, 3:14 PM

## 2025-01-04 ENCOUNTER — Ambulatory Visit (HOSPITAL_BASED_OUTPATIENT_CLINIC_OR_DEPARTMENT_OTHER): Admitting: Cardiovascular Disease

## 2025-02-01 ENCOUNTER — Ambulatory Visit (HOSPITAL_BASED_OUTPATIENT_CLINIC_OR_DEPARTMENT_OTHER): Admitting: Cardiovascular Disease
